# Patient Record
Sex: Female | Born: 1985 | Race: White | Hispanic: No | Marital: Single | Smoking: Current every day smoker
Health system: Southern US, Community
[De-identification: ages and names within clinical notes are randomized; demographics above are authoritative.]

## PROBLEM LIST (undated history)

## (undated) DIAGNOSIS — F419 Anxiety disorder, unspecified: Secondary | ICD-10-CM

## (undated) DIAGNOSIS — Z8051 Family history of malignant neoplasm of kidney: Secondary | ICD-10-CM

## (undated) DIAGNOSIS — N920 Excessive and frequent menstruation with regular cycle: Secondary | ICD-10-CM

## (undated) DIAGNOSIS — R112 Nausea with vomiting, unspecified: Secondary | ICD-10-CM

## (undated) DIAGNOSIS — A419 Sepsis, unspecified organism: Secondary | ICD-10-CM

## (undated) DIAGNOSIS — Z973 Presence of spectacles and contact lenses: Secondary | ICD-10-CM

## (undated) DIAGNOSIS — Z98891 History of uterine scar from previous surgery: Secondary | ICD-10-CM

## (undated) DIAGNOSIS — Z87410 Personal history of cervical dysplasia: Secondary | ICD-10-CM

## (undated) DIAGNOSIS — O24419 Gestational diabetes mellitus in pregnancy, unspecified control: Secondary | ICD-10-CM

## (undated) DIAGNOSIS — F988 Other specified behavioral and emotional disorders with onset usually occurring in childhood and adolescence: Secondary | ICD-10-CM

## (undated) DIAGNOSIS — Z9889 Other specified postprocedural states: Secondary | ICD-10-CM

## (undated) DIAGNOSIS — R87629 Unspecified abnormal cytological findings in specimens from vagina: Secondary | ICD-10-CM

## (undated) DIAGNOSIS — K219 Gastro-esophageal reflux disease without esophagitis: Secondary | ICD-10-CM

## (undated) DIAGNOSIS — Z8041 Family history of malignant neoplasm of ovary: Secondary | ICD-10-CM

## (undated) DIAGNOSIS — Z8481 Family history of carrier of genetic disease: Secondary | ICD-10-CM

## (undated) DIAGNOSIS — Z800 Family history of malignant neoplasm of digestive organs: Secondary | ICD-10-CM

## (undated) DIAGNOSIS — Z8632 Personal history of gestational diabetes: Secondary | ICD-10-CM

## (undated) DIAGNOSIS — N3281 Overactive bladder: Secondary | ICD-10-CM

## (undated) HISTORY — DX: Family history of malignant neoplasm of digestive organs: Z80.0

## (undated) HISTORY — PX: WISDOM TOOTH EXTRACTION: SHX21

## (undated) HISTORY — DX: Family history of carrier of genetic disease: Z84.81

## (undated) HISTORY — PX: TONSILLECTOMY: SUR1361

## (undated) HISTORY — DX: Family history of malignant neoplasm of kidney: Z80.51

## (undated) HISTORY — PX: CHOLECYSTECTOMY: SHX55

---

## 2008-06-28 HISTORY — PX: LAPAROSCOPIC CHOLECYSTECTOMY: SUR755

## 2016-02-19 ENCOUNTER — Emergency Department (HOSPITAL_COMMUNITY): Payer: Medicaid Other

## 2016-02-19 ENCOUNTER — Observation Stay (HOSPITAL_COMMUNITY): Payer: Medicaid Other | Admitting: Anesthesiology

## 2016-02-19 ENCOUNTER — Observation Stay (HOSPITAL_COMMUNITY)
Admission: EM | Admit: 2016-02-19 | Discharge: 2016-02-20 | Disposition: A | Discharge status: Home / Self Care | Payer: Medicaid Other | Attending: Surgery | Admitting: Surgery

## 2016-02-19 ENCOUNTER — Encounter (HOSPITAL_COMMUNITY)
Admission: EM | Disposition: A | Discharge status: Home / Self Care | Payer: Self-pay | Source: Home / Self Care | Attending: Emergency Medicine

## 2016-02-19 ENCOUNTER — Encounter (HOSPITAL_COMMUNITY): Payer: Self-pay | Admitting: Emergency Medicine

## 2016-02-19 DIAGNOSIS — N390 Urinary tract infection, site not specified: Secondary | ICD-10-CM | POA: Insufficient documentation

## 2016-02-19 DIAGNOSIS — F17210 Nicotine dependence, cigarettes, uncomplicated: Secondary | ICD-10-CM | POA: Diagnosis not present

## 2016-02-19 DIAGNOSIS — N764 Abscess of vulva: Secondary | ICD-10-CM | POA: Diagnosis not present

## 2016-02-19 DIAGNOSIS — L02215 Cutaneous abscess of perineum: Secondary | ICD-10-CM

## 2016-02-19 HISTORY — DX: Other specified postprocedural states: Z98.890

## 2016-02-19 HISTORY — DX: Nausea with vomiting, unspecified: R11.2

## 2016-02-19 HISTORY — PX: IRRIGATION AND DEBRIDEMENT ABSCESS: SHX5252

## 2016-02-19 HISTORY — DX: Sepsis, unspecified organism: A41.9

## 2016-02-19 LAB — I-STAT BETA HCG BLOOD, ED (MC, WL, AP ONLY): I-stat hCG, quantitative: <5 m[IU]/mL

## 2016-02-19 LAB — URINALYSIS, ROUTINE W REFLEX MICROSCOPIC
Bilirubin Urine: NEGATIVE
Glucose, UA: NEGATIVE mg/dL
Ketones, ur: NEGATIVE mg/dL
Nitrite: NEGATIVE
Protein, ur: NEGATIVE mg/dL
Specific Gravity, Urine: 1.024 (ref 1.005–1.030)
pH: 6 (ref 5.0–8.0)

## 2016-02-19 LAB — URINE MICROSCOPIC-ADD ON

## 2016-02-19 LAB — BASIC METABOLIC PANEL WITH GFR
Anion gap: 7 (ref 5–15)
BUN: 8 mg/dL (ref 6–20)
CO2: 22 mmol/L (ref 22–32)
Calcium: 8.9 mg/dL (ref 8.9–10.3)
Chloride: 108 mmol/L (ref 101–111)
Creatinine, Ser: 0.57 mg/dL (ref 0.44–1.00)
GFR calc Af Amer: >60 mL/min
GFR calc non Af Amer: >60 mL/min
Glucose, Bld: 88 mg/dL (ref 65–99)
Potassium: 3.5 mmol/L (ref 3.5–5.1)
Sodium: 137 mmol/L (ref 135–145)

## 2016-02-19 LAB — CBC WITH DIFFERENTIAL/PLATELET
Basophils Absolute: 0 K/uL (ref 0.0–0.1)
Basophils Relative: 0 %
Eosinophils Absolute: 0.1 K/uL (ref 0.0–0.7)
Eosinophils Relative: 1 %
HCT: 40.7 % (ref 36.0–46.0)
Hemoglobin: 13.8 g/dL (ref 12.0–15.0)
Lymphocytes Relative: 14 %
Lymphs Abs: 2.4 K/uL (ref 0.7–4.0)
MCH: 31.2 pg (ref 26.0–34.0)
MCHC: 33.9 g/dL (ref 30.0–36.0)
MCV: 92.1 fL (ref 78.0–100.0)
Monocytes Absolute: 0.9 K/uL (ref 0.1–1.0)
Monocytes Relative: 5 %
Neutro Abs: 14 K/uL — ABNORMAL HIGH (ref 1.7–7.7)
Neutrophils Relative %: 80 %
Platelets: 310 K/uL (ref 150–400)
RBC: 4.42 MIL/uL (ref 3.87–5.11)
RDW: 12.6 % (ref 11.5–15.5)
WBC: 17.4 K/uL — ABNORMAL HIGH (ref 4.0–10.5)

## 2016-02-19 LAB — WET PREP, GENITAL
Clue Cells Wet Prep HPF POC: NONE SEEN
Sperm: NONE SEEN
Trich, Wet Prep: NONE SEEN
Yeast Wet Prep HPF POC: NONE SEEN

## 2016-02-19 LAB — I-STAT CREATININE, ED: Creatinine, Ser: 0.6 mg/dL (ref 0.44–1.00)

## 2016-02-19 SURGERY — IRRIGATION AND DEBRIDEMENT ABSCESS
Anesthesia: General | Site: Vagina

## 2016-02-19 MED ORDER — BUPIVACAINE-EPINEPHRINE 0.5% -1:200000 IJ SOLN
INTRAMUSCULAR | Status: DC | PRN
  Administered 2016-02-19: 18 mL

## 2016-02-19 MED ORDER — HYDROMORPHONE HCL 1 MG/ML IJ SOLN: 0.2500 mg | INTRAMUSCULAR | Status: DC | PRN

## 2016-02-19 MED ORDER — LIDOCAINE HCL (CARDIAC) 20 MG/ML IV SOLN
INTRAVENOUS | Status: DC | PRN
  Administered 2016-02-19: 100 mg via INTRAVENOUS

## 2016-02-19 MED ORDER — DIPHENHYDRAMINE HCL 25 MG PO CAPS: 25.0000 mg | ORAL_CAPSULE | Freq: Four times a day (QID) | ORAL | Status: DC | PRN

## 2016-02-19 MED ORDER — PROPOFOL 10 MG/ML IV BOLUS
INTRAVENOUS | Status: AC
  Filled 2016-02-19: qty 20

## 2016-02-19 MED ORDER — KETOROLAC TROMETHAMINE 30 MG/ML IJ SOLN
INTRAMUSCULAR | Status: AC
  Filled 2016-02-19: qty 1

## 2016-02-19 MED ORDER — ACETAMINOPHEN 650 MG RE SUPP: 650.0000 mg | Freq: Four times a day (QID) | RECTAL | Status: DC | PRN

## 2016-02-19 MED ORDER — ONDANSETRON 4 MG PO TBDP
4.0000 mg | ORAL_TABLET | Freq: Four times a day (QID) | ORAL | Status: DC | PRN
  Administered 2016-02-19 – 2016-02-20 (×2): 4 mg via ORAL
  Filled 2016-02-19 (×2): qty 1

## 2016-02-19 MED ORDER — CEFTRIAXONE SODIUM 2 G IJ SOLR
2.0000 g | Freq: Once | INTRAMUSCULAR | Status: AC
  Administered 2016-02-19: 2 g via INTRAVENOUS
  Filled 2016-02-19: qty 2

## 2016-02-19 MED ORDER — DIPHENHYDRAMINE HCL 50 MG/ML IJ SOLN: 25.0000 mg | Freq: Four times a day (QID) | INTRAMUSCULAR | Status: DC | PRN

## 2016-02-19 MED ORDER — SODIUM CHLORIDE 0.9 % IR SOLN
Status: DC | PRN
  Administered 2016-02-19: 1000 mL

## 2016-02-19 MED ORDER — AZITHROMYCIN 250 MG PO TABS
1000.0000 mg | ORAL_TABLET | Freq: Once | ORAL | Status: AC
  Administered 2016-02-19: 1000 mg via ORAL
  Filled 2016-02-19: qty 4

## 2016-02-19 MED ORDER — METOCLOPRAMIDE HCL 5 MG/ML IJ SOLN
INTRAMUSCULAR | Status: DC | PRN
  Administered 2016-02-19: 10 mg via INTRAVENOUS

## 2016-02-19 MED ORDER — ONDANSETRON HCL 4 MG/2ML IJ SOLN
INTRAMUSCULAR | Status: AC
  Filled 2016-02-19: qty 2

## 2016-02-19 MED ORDER — IOPAMIDOL (ISOVUE-300) INJECTION 61%
100.0000 mL | Freq: Once | INTRAVENOUS | Status: AC | PRN
  Administered 2016-02-19: 100 mL via INTRAVENOUS

## 2016-02-19 MED ORDER — FENTANYL CITRATE (PF) 100 MCG/2ML IJ SOLN
INTRAMUSCULAR | Status: AC
  Filled 2016-02-19: qty 4

## 2016-02-19 MED ORDER — BUPIVACAINE-EPINEPHRINE (PF) 0.5% -1:200000 IJ SOLN
INTRAMUSCULAR | Status: AC
  Filled 2016-02-19: qty 30

## 2016-02-19 MED ORDER — PROPOFOL 10 MG/ML IV BOLUS
INTRAVENOUS | Status: DC | PRN
  Administered 2016-02-19: 200 mg via INTRAVENOUS

## 2016-02-19 MED ORDER — ACETAMINOPHEN 325 MG PO TABS: 650.0000 mg | ORAL_TABLET | Freq: Four times a day (QID) | ORAL | Status: DC | PRN

## 2016-02-19 MED ORDER — DIATRIZOATE MEGLUMINE & SODIUM 66-10 % PO SOLN
15.0000 mL | Freq: Once | ORAL | Status: AC
  Administered 2016-02-19: 15 mL via ORAL

## 2016-02-19 MED ORDER — HYDROMORPHONE HCL 1 MG/ML IJ SOLN
0.5000 mg | INTRAMUSCULAR | Status: DC | PRN
  Administered 2016-02-19 – 2016-02-20 (×3): 1 mg via INTRAVENOUS
  Filled 2016-02-19 (×3): qty 1

## 2016-02-19 MED ORDER — DEXAMETHASONE SODIUM PHOSPHATE 10 MG/ML IJ SOLN
INTRAMUSCULAR | Status: AC
  Filled 2016-02-19: qty 1

## 2016-02-19 MED ORDER — MORPHINE SULFATE (PF) 2 MG/ML IV SOLN
2.0000 mg | Freq: Once | INTRAVENOUS | Status: AC
  Administered 2016-02-19: 2 mg via INTRAVENOUS
  Filled 2016-02-19: qty 1

## 2016-02-19 MED ORDER — OXYCODONE-ACETAMINOPHEN 5-325 MG PO TABS
1.0000 | ORAL_TABLET | ORAL | Status: DC | PRN
  Administered 2016-02-19 – 2016-02-20 (×2): 1 via ORAL
  Filled 2016-02-19 (×3): qty 1

## 2016-02-19 MED ORDER — ONDANSETRON HCL 4 MG/2ML IJ SOLN
4.0000 mg | Freq: Four times a day (QID) | INTRAMUSCULAR | Status: DC | PRN
  Administered 2016-02-19: 4 mg via INTRAVENOUS

## 2016-02-19 MED ORDER — IBUPROFEN 800 MG PO TABS
800.0000 mg | ORAL_TABLET | Freq: Once | ORAL | Status: AC
  Administered 2016-02-19: 800 mg via ORAL
  Filled 2016-02-19: qty 1

## 2016-02-19 MED ORDER — MIDAZOLAM HCL 2 MG/2ML IJ SOLN
INTRAMUSCULAR | Status: AC
  Filled 2016-02-19: qty 2

## 2016-02-19 MED ORDER — KETOROLAC TROMETHAMINE 30 MG/ML IJ SOLN
INTRAMUSCULAR | Status: DC | PRN
  Administered 2016-02-19: 30 mg via INTRAVENOUS

## 2016-02-19 MED ORDER — MIDAZOLAM HCL 5 MG/5ML IJ SOLN
INTRAMUSCULAR | Status: DC | PRN
  Administered 2016-02-19: 2 mg via INTRAVENOUS

## 2016-02-19 MED ORDER — HEPARIN SODIUM (PORCINE) 5000 UNIT/ML IJ SOLN: 5000.0000 [IU] | Freq: Three times a day (TID) | INTRAMUSCULAR | Status: DC

## 2016-02-19 MED ORDER — DEXAMETHASONE SODIUM PHOSPHATE 10 MG/ML IJ SOLN
INTRAMUSCULAR | Status: DC | PRN
  Administered 2016-02-19: 10 mg via INTRAVENOUS

## 2016-02-19 MED ORDER — HYDROCODONE-ACETAMINOPHEN 7.5-325 MG PO TABS: 1.0000 | ORAL_TABLET | Freq: Once | ORAL | Status: DC | PRN

## 2016-02-19 MED ORDER — DEXTROSE 5 % IV SOLN
2.0000 g | INTRAVENOUS | Status: DC
  Filled 2016-02-19: qty 2

## 2016-02-19 MED ORDER — METOCLOPRAMIDE HCL 5 MG/ML IJ SOLN
INTRAMUSCULAR | Status: AC
  Filled 2016-02-19: qty 2

## 2016-02-19 MED ORDER — PROMETHAZINE HCL 25 MG/ML IJ SOLN: 6.2500 mg | INTRAMUSCULAR | Status: DC | PRN

## 2016-02-19 MED ORDER — FENTANYL CITRATE (PF) 100 MCG/2ML IJ SOLN
INTRAMUSCULAR | Status: DC | PRN
  Administered 2016-02-19: 50 ug via INTRAVENOUS
  Administered 2016-02-19: 100 ug via INTRAVENOUS
  Administered 2016-02-19: 50 ug via INTRAVENOUS

## 2016-02-19 MED ORDER — KETOROLAC TROMETHAMINE 30 MG/ML IJ SOLN
30.0000 mg | Freq: Once | INTRAMUSCULAR | Status: AC
  Administered 2016-02-19: 30 mg via INTRAVENOUS
  Filled 2016-02-19: qty 1

## 2016-02-19 MED ORDER — ONDANSETRON HCL 4 MG/2ML IJ SOLN
4.0000 mg | Freq: Once | INTRAMUSCULAR | Status: AC
  Administered 2016-02-19: 4 mg via INTRAVENOUS
  Filled 2016-02-19: qty 2

## 2016-02-19 MED ORDER — LACTATED RINGERS IV SOLN
INTRAVENOUS | Status: DC | PRN
  Administered 2016-02-19: 17:00:00 via INTRAVENOUS

## 2016-02-19 SURGICAL SUPPLY — 35 items
BLADE HEX COATED 2.75 (ELECTRODE) ×3 IMPLANT
BLADE SURG SZ10 CARB STEEL (BLADE) ×3 IMPLANT
COVER SURGICAL LIGHT HANDLE (MISCELLANEOUS) ×3 IMPLANT
DECANTER SPIKE VIAL GLASS SM (MISCELLANEOUS) IMPLANT
DRAPE LAPAROSCOPIC ABDOMINAL (DRAPES) IMPLANT
DRAPE LAPAROTOMY T 102X78X121 (DRAPES) IMPLANT
DRAPE LAPAROTOMY TRNSV 102X78 (DRAPE) IMPLANT
DRAPE SHEET LG 3/4 BI-LAMINATE (DRAPES) IMPLANT
ELECT PENCIL ROCKER SW 15FT (MISCELLANEOUS) ×3 IMPLANT
ELECT REM PT RETURN 9FT ADLT (ELECTROSURGICAL) ×3
ELECTRODE REM PT RTRN 9FT ADLT (ELECTROSURGICAL) ×1 IMPLANT
GAUZE SPONGE 4X4 12PLY STRL (GAUZE/BANDAGES/DRESSINGS) ×3 IMPLANT
GLOVE BIO SURGEON STRL SZ7 (GLOVE) ×9 IMPLANT
GLOVE BIOGEL PI IND STRL 7.0 (GLOVE) ×1 IMPLANT
GLOVE BIOGEL PI IND STRL 7.5 (GLOVE) ×3 IMPLANT
GLOVE BIOGEL PI INDICATOR 7.0 (GLOVE) ×2
GLOVE BIOGEL PI INDICATOR 7.5 (GLOVE) ×6
GLOVE SURG SIGNA 7.5 PF LTX (GLOVE) ×3 IMPLANT
GOWN STRL REUS W/ TWL XL LVL3 (GOWN DISPOSABLE) ×1 IMPLANT
GOWN STRL REUS W/TWL LRG LVL3 (GOWN DISPOSABLE) ×6 IMPLANT
GOWN STRL REUS W/TWL XL LVL3 (GOWN DISPOSABLE) ×5 IMPLANT
KIT BASIN OR (CUSTOM PROCEDURE TRAY) ×3 IMPLANT
LIQUID BAND (GAUZE/BANDAGES/DRESSINGS) IMPLANT
MARKER SKIN DUAL TIP RULER LAB (MISCELLANEOUS) ×3 IMPLANT
NEEDLE HYPO 25X1 1.5 SAFETY (NEEDLE) ×3 IMPLANT
NS IRRIG 1000ML POUR BTL (IV SOLUTION) ×3 IMPLANT
PACK BASIC VI WITH GOWN DISP (CUSTOM PROCEDURE TRAY) ×3 IMPLANT
SPONGE LAP 18X18 X RAY DECT (DISPOSABLE) IMPLANT
SPONGE LAP 4X18 X RAY DECT (DISPOSABLE) IMPLANT
STAPLER VISISTAT 35W (STAPLE) IMPLANT
SUT MNCRL AB 4-0 PS2 18 (SUTURE) IMPLANT
SUT VIC AB 3-0 SH 18 (SUTURE) IMPLANT
SYR CONTROL 10ML LL (SYRINGE) ×3 IMPLANT
TOWEL OR 17X26 10 PK STRL BLUE (TOWEL DISPOSABLE) ×3 IMPLANT
YANKAUER SUCT BULB TIP 10FT TU (MISCELLANEOUS) ×3 IMPLANT

## 2016-02-19 NOTE — H&P (Signed)
Laurie Warren is an 30 y.o. female.   Chief Complaint:  Left sided vaginal mass going to rectum, pain and difficulty voiding. HPI: 30 y/o female seen with the above noted complaints that started about 48 hours ago.  She could not get into see any of the OB-Gyn doctors and came to the ED.    Work up shows she is afebrile, VSS. Labs show normal BMP, WBC 17.4, Probable UTI, She refused vaginal exam by ED.  CT scan show a 17 x 34 x 22 mm subfascial fluid collection.  Minimal surrounding fat stranding, no subcutaneous gas or foreign bodies seen on CT.  History reviewed. No pertinent past medical history.  None  Past Surgical History:  Procedure Laterality Date  . CESAREAN SECTION     8 years ago  . CHOLECYSTECTOMY      No family history on file. Social History:  reports that she has been smoking Cigarettes.  She has never used smokeless tobacco. She reports that she drinks alcohol. Her drug history is not on file.  Allergies:  Allergies  Allergen Reactions  . Sulfa Antibiotics Anaphylaxis  . Latex Swelling      Redness.       Pain pills cause nausea- needs antiemetic with them   Prior to Admission medications   Medication Sig Start Date End Date Taking? Authorizing Provider  ibuprofen (ADVIL,MOTRIN) 200 MG tablet Take 800 mg by mouth every 6 (six) hours as needed for moderate pain.   Yes Historical Provider, MD     (Not in a hospital admission)  Results for orders placed or performed during the hospital encounter of 02/19/16 (from the past 48 hour(s))  CBC with Differential     Status: Abnormal   Collection Time: 02/19/16  1:01 PM  Result Value Ref Range   WBC 17.4 (H) 4.0 - 10.5 K/uL   RBC 4.42 3.87 - 5.11 MIL/uL   Hemoglobin 13.8 12.0 - 15.0 g/dL   HCT 40.7 36.0 - 46.0 %   MCV 92.1 78.0 - 100.0 fL   MCH 31.2 26.0 - 34.0 pg   MCHC 33.9 30.0 - 36.0 g/dL   RDW 12.6 11.5 - 15.5 %   Platelets 310 150 - 400 K/uL   Neutrophils Relative % 80 %   Neutro Abs 14.0 (H) 1.7 - 7.7 K/uL    Lymphocytes Relative 14 %   Lymphs Abs 2.4 0.7 - 4.0 K/uL   Monocytes Relative 5 %   Monocytes Absolute 0.9 0.1 - 1.0 K/uL   Eosinophils Relative 1 %   Eosinophils Absolute 0.1 0.0 - 0.7 K/uL   Basophils Relative 0 %   Basophils Absolute 0.0 0.0 - 0.1 K/uL  Basic metabolic panel     Status: None   Collection Time: 02/19/16  1:01 PM  Result Value Ref Range   Sodium 137 135 - 145 mmol/L   Potassium 3.5 3.5 - 5.1 mmol/L   Chloride 108 101 - 111 mmol/L   CO2 22 22 - 32 mmol/L   Glucose, Bld 88 65 - 99 mg/dL   BUN 8 6 - 20 mg/dL   Creatinine, Ser 0.57 0.44 - 1.00 mg/dL   Calcium 8.9 8.9 - 10.3 mg/dL   GFR calc non Af Amer >60 >60 mL/min   GFR calc Af Amer >60 >60 mL/min    Comment: (NOTE) The eGFR has been calculated using the CKD EPI equation. This calculation has not been validated in all clinical situations. eGFR's persistently <60 mL/min signify possible Chronic  Kidney Disease.    Anion gap 7 5 - 15  Urinalysis, Routine w reflex microscopic     Status: Abnormal   Collection Time: 02/19/16  1:06 PM  Result Value Ref Range   Color, Urine YELLOW YELLOW   APPearance CLOUDY (A) CLEAR   Specific Gravity, Urine 1.024 1.005 - 1.030   pH 6.0 5.0 - 8.0   Glucose, UA NEGATIVE NEGATIVE mg/dL   Hgb urine dipstick SMALL (A) NEGATIVE   Bilirubin Urine NEGATIVE NEGATIVE   Ketones, ur NEGATIVE NEGATIVE mg/dL   Protein, ur NEGATIVE NEGATIVE mg/dL   Nitrite NEGATIVE NEGATIVE   Leukocytes, UA SMALL (A) NEGATIVE  Urine microscopic-add on     Status: Abnormal   Collection Time: 02/19/16  1:06 PM  Result Value Ref Range   Squamous Epithelial / LPF 6-30 (A) NONE SEEN   WBC, UA 6-30 0 - 5 WBC/hpf   RBC / HPF 6-30 0 - 5 RBC/hpf   Bacteria, UA MANY (A) NONE SEEN   Urine-Other MUCOUS PRESENT   I-stat Creatinine, ED     Status: None   Collection Time: 02/19/16  1:24 PM  Result Value Ref Range   Creatinine, Ser 0.60 0.44 - 1.00 mg/dL  I-Stat beta hCG blood, ED     Status: None   Collection  Time: 02/19/16  1:36 PM  Result Value Ref Range   I-stat hCG, quantitative <5.0 <5 mIU/mL   Comment 3            Comment:   GEST. AGE      CONC.  (mIU/mL)   <=1 WEEK        5 - 50     2 WEEKS       50 - 500     3 WEEKS       100 - 10,000     4 WEEKS     1,000 - 30,000        FEMALE AND NON-PREGNANT FEMALE:     LESS THAN 5 mIU/mL    Ct Abdomen Pelvis W Contrast  Result Date: 02/19/2016 CLINICAL DATA:  Painful perineal mass beginning 2 days ago, from LEFT vagina to LEFT buttock. Difficulty urinating. History of cholecystectomy and abscess. EXAM: CT ABDOMEN AND PELVIS WITH CONTRAST TECHNIQUE: Multidetector CT imaging of the abdomen and pelvis was performed using the standard protocol following bolus administration of intravenous contrast. CONTRAST:  150m ISOVUE-300 IOPAMIDOL (ISOVUE-300) INJECTION 61% COMPARISON:  None. FINDINGS: LUNG BASES: Included view of the lung bases are clear. Visualized heart and pericardium are unremarkable. SOLID ORGANS: The liver, spleen, pancreas and adrenal glands are unremarkable. Status post cholecystectomy. GASTROINTESTINAL TRACT: The stomach, small and large bowel are normal in course and caliber without inflammatory changes. Normal appendix. KIDNEYS/ URINARY TRACT: Kidneys are orthotopic, demonstrating symmetric enhancement. No nephrolithiasis, hydronephrosis or solid renal masses. The unopacified ureters are normal in course and caliber. Urinary bladder is partially distended and unremarkable. PERITONEUM/RETROPERITONEUM: Aortoiliac vessels are normal in course and caliber. No lymphadenopathy by CT size criteria. Internal reproductive organs are unremarkable. No intraperitoneal free fluid nor free air. Phleboliths in the pelvis. SOFT TISSUE/OSSEOUS STRUCTURES: LEFT perineal 17 x 34 x 22 mm rim enhancing fluid collection without subfascial extent, within 13 mm of skin surface. Minimal surrounding fat stranding without subcutaneous gas or radiopaque foreign bodies.  IMPRESSION: 17 x 34 x 22 mm LEFT perivaginal/ perineal abscess . No acute intra-abdominal or pelvic process. Electronically Signed   By: CElon AlasM.D.   On:  02/19/2016 14:27    Review of Systems  Constitutional: Negative.   HENT: Negative.   Eyes: Negative.   Respiratory: Negative.   Cardiovascular: Negative.   Gastrointestinal: Negative.   Genitourinary: Negative.   Musculoskeletal: Negative.   Skin: Negative.   Neurological: Negative.   Endo/Heme/Allergies: Negative.     Blood pressure 102/65, pulse 83, temperature 98.9 F (37.2 C), temperature source Oral, resp. rate 17, height _0  (1.575 m), weight 59 kg (130 lb), last menstrual period 01/31/2016, SpO2 98 %. Physical Exam  Constitutional: She is oriented to person, place, and time. She appears well-developed and well-nourished. She appears distressed.  HENT:  Head: Normocephalic and atraumatic.  Mouth/Throat: No oropharyngeal exudate.  Eyes: Right eye exhibits no discharge. Left eye exhibits no discharge. No scleral icterus.  Neck: Normal range of motion. Neck supple. No JVD present. No tracheal deviation present. No thyromegaly present.  Cardiovascular: Normal rate, regular rhythm, normal heart sounds and intact distal pulses.   No murmur heard. Respiratory: Effort normal and breath sounds normal. No respiratory distress. She has no wheezes. She has no rales. She exhibits no tenderness.  GI: Soft. Bowel sounds are normal. She exhibits no distension and no mass. There is no tenderness. There is no rebound and no guarding.  Genitourinary:  Genitourinary Comments: Swelling and tender left labia majora, some erythema goes toward rectum  Musculoskeletal: She exhibits no edema or tenderness.  Lymphadenopathy:    She has no cervical adenopathy.  Neurological: She is alert and oriented to person, place, and time. No cranial nerve deficit.  Skin: Skin is warm and dry. No rash noted. No erythema. No pallor.  Psychiatric:  She has a normal mood and affect. Her behavior is normal. Judgment and thought content normal.     Assessment/Plan Perivaginal abscess UTI ED will treat for possible GC/Chlymadia   Plan:  Admit and take to OR for I&D of site.  Jannat Rosemeyer, PA-C 02/19/2016, 4:16 PM

## 2016-02-19 NOTE — Progress Notes (Signed)
Patient ID: Laurie Warren, female   DOB: May 19, 1986, 30 y.o.   MRN: [id number]   Patient with perineal/labial abscess. Needs Incision and Drainage in the OR.  I discussed the procedure and risks with the patient.  These include but are not limited to bleeding, infection, injury to surrounding structures, recurrence, need for further surgery, etc.  She agrees to proceed.

## 2016-02-19 NOTE — ED Provider Notes (Signed)
[facility] DEPT Provider Note   CSN: [id number] Arrival date & time: 02/19/16  1113     History   Chief Complaint Chief Complaint  Patient presents with  . genital mass  . Urinary Retention    HPI Laurie Warren is a 30 y.o. female with no significant Medical history who presents to the ED today complaining of pelvic mass and pain. Patient states that 2 days ago she noticed a tingling sensation and pain to her external vagina. Patient states that since that time the area has continued to swell and become more painful. Patient states that now she is having difficulty urinating because she feels like the urine cannot get around the mass. Patient states it is painful to sit down. She denies any vaginal discharge, dysuria, hematuria, vaginal bleeding. LMP was 01/31/2016. Patient states that she attempted to call multiple OB/GYN prior to coming but was unable to get an appointment. She denies any fevers or chills. Patient has been taken ibuprofen without relief of her pain.  HPI  History reviewed. No pertinent past medical history.  There are no active problems to display for this patient.   Past Surgical History:  Procedure Laterality Date  . CHOLECYSTECTOMY      OB History    No data available       Home Medications    Prior to Admission medications   Medication Sig Start Date End Date Taking? Authorizing Provider  ibuprofen (ADVIL,MOTRIN) 200 MG tablet Take 800 mg by mouth every 6 (six) hours as needed for moderate pain.   Yes Historical Provider, MD    Family History No family history on file.  Social History Social History  Substance Use Topics  . Smoking status: Current Every Day Smoker    Types: Cigarettes  . Smokeless tobacco: Never Used  . Alcohol use Yes     Comment: social drinker     Allergies   Sulfa antibiotics and Latex   Review of Systems Review of Systems  All other systems reviewed and are negative.    Physical Exam Updated Vital  Signs BP 112/90 (BP Location: Left Arm)   Pulse 93   Temp 98.9 F (37.2 C) (Oral)   Resp 18   Ht 5\' 2"  (1.575 m)   Wt 59 kg   LMP 01/31/2016   SpO2 100%   BMI 23.78 kg/m   Physical Exam  Constitutional: She is oriented to person, place, and time. She appears well-developed and well-nourished. No distress.  HENT:  Head: Normocephalic and atraumatic.  Mouth/Throat: No oropharyngeal exudate.  Eyes: Conjunctivae and EOM are normal. Pupils are equal, round, and reactive to light. Right eye exhibits no discharge. Left eye exhibits no discharge. No scleral icterus.  Cardiovascular: Normal rate, regular rhythm, normal heart sounds and intact distal pulses.  Exam reveals no gallop and no friction rub.   No murmur heard. Pulmonary/Chest: Effort normal and breath sounds normal. No respiratory distress. She has no wheezes. She has no rales. She exhibits no tenderness.  Abdominal: Soft. She exhibits no distension. There is no tenderness. There is no guarding.  Genitourinary:  Genitourinary Comments: Large fluctuant mass at base of left vagina extending into peroneum and internal vagina. Extremely TTP.  Musculoskeletal: Normal range of motion. She exhibits no edema.  Neurological: She is alert and oriented to person, place, and time.  Skin: Skin is warm and dry. No rash noted. She is not diaphoretic. No erythema. No pallor.  Psychiatric: She has a normal mood and affect.  Her behavior is normal.  Nursing note and vitals reviewed.    ED Treatments / Results  Labs (all labs ordered are listed, but only abnormal results are displayed) Labs Reviewed - No data to display  EKG  EKG Interpretation None       Radiology Ct Abdomen Pelvis W Contrast  Result Date: 02/19/2016 CLINICAL DATA:  Painful perineal mass beginning 2 days ago, from LEFT vagina to LEFT buttock. Difficulty urinating. History of cholecystectomy and abscess. EXAM: CT ABDOMEN AND PELVIS WITH CONTRAST TECHNIQUE: Multidetector  CT imaging of the abdomen and pelvis was performed using the standard protocol following bolus administration of intravenous contrast. CONTRAST:  121mL ISOVUE-300 IOPAMIDOL (ISOVUE-300) INJECTION 61% COMPARISON:  None. FINDINGS: LUNG BASES: Included view of the lung bases are clear. Visualized heart and pericardium are unremarkable. SOLID ORGANS: The liver, spleen, pancreas and adrenal glands are unremarkable. Status post cholecystectomy. GASTROINTESTINAL TRACT: The stomach, small and large bowel are normal in course and caliber without inflammatory changes. Normal appendix. KIDNEYS/ URINARY TRACT: Kidneys are orthotopic, demonstrating symmetric enhancement. No nephrolithiasis, hydronephrosis or solid renal masses. The unopacified ureters are normal in course and caliber. Urinary bladder is partially distended and unremarkable. PERITONEUM/RETROPERITONEUM: Aortoiliac vessels are normal in course and caliber. No lymphadenopathy by CT size criteria. Internal reproductive organs are unremarkable. No intraperitoneal free fluid nor free air. Phleboliths in the pelvis. SOFT TISSUE/OSSEOUS STRUCTURES: LEFT perineal 17 x 34 x 22 mm rim enhancing fluid collection without subfascial extent, within 13 mm of skin surface. Minimal surrounding fat stranding without subcutaneous gas or radiopaque foreign bodies. IMPRESSION: 17 x 34 x 22 mm LEFT perivaginal/ perineal abscess . No acute intra-abdominal or pelvic process. Electronically Signed   By: Elon Alas M.D.   On: 02/19/2016 14:27    Procedures Procedures (including critical care time)  Medications Ordered in ED Medications  ibuprofen (ADVIL,MOTRIN) tablet 800 mg (not administered)     Initial Impression / Assessment and Plan / ED Course  I have reviewed the triage vital signs and the nursing notes.  Pertinent labs & imaging results that were available during my care of the patient were reviewed by me and considered in my medical decision making (see chart  for details).  Otherwise healthy 30 y.o F presents to the ED today c/o vaginal pain and mass that has been developing over the last 2 days. Mass has gotten progressively larger and now causing urinary retention. On exam, pt appears very uncomfortable, unable to sit down. Afebrile. Fluctuant mass seen on left lower labia and extending into peroneum and perirectal area. Mass also extends into vagina. Extremely TTP. Concern for deeper space infection. CT scan obtained which reveals 17 x 34 x 22 left perineal abscess. Partially distended urinary bladder. Leukocytosis present, WBC 17.6 with left shift. Concern that abscess may be too complex to perform at bedside, pt in severe pain. Will consult surgery to get input if this may require possible OR drainage.  Clinical Course  Comment By Time  Spoke with Will Creig Hines, PA-C with general surgery who states that they will take pt to OR for I&D. Recommends collecting swabs for GC, chlamydia. Will give 2g Rocephin now. Will schedule azithromycin to be given in the AM. Pt made NPO Carlos Levering, PA-C 08/24 1549       Final Clinical Impressions(s) / ED Diagnoses   Final diagnoses:  Perineal abscess    New Prescriptions New Prescriptions   No medications on file     Carlos Levering,  PA-C 02/19/16 Boon Liu, MD 02/19/16 [phone]

## 2016-02-19 NOTE — ED Triage Notes (Signed)
Patient c/o of mass in left side of vagina that spreads to left buttock. Patient states that started on Tuesday and is very painful and now patient is having a hard time getting urin to come out.  Patient states that there is no head and no drainage.

## 2016-02-19 NOTE — Transfer of Care (Signed)
Immediate Anesthesia Transfer of Care Note  Patient: Laurie Warren  Procedure(s) Performed: Procedure(s): IRRIGATION AND DEBRIDEMENT LEFT LABIAL ABSCESS (N/A)  Patient Location: PACU  Anesthesia Type:General  Level of Consciousness:  sedated, patient cooperative and responds to stimulation  Airway & Oxygen Therapy:Patient Spontanous Breathing and Patient connected to face mask oxgen  Post-op Assessment:  Report given to PACU RN and Post -op Vital signs reviewed and stable  Post vital signs:  Reviewed and stable  Last Vitals:  Vitals:   02/19/16 1433 02/19/16 1625  BP: 102/65 109/79  Pulse: 83 78  Resp: 17 18  Temp:      Complications: No apparent anesthesia complications

## 2016-02-19 NOTE — ED Notes (Signed)
Patient comes from home with c/o vaginal pain.  Patient endorses nausea and chills, but denies fever.  Patient has area of redness and induration to around 5 o'clock along labia majora.  Erythema and induration extends several cm below labia with cellulitis extending even further.

## 2016-02-19 NOTE — Progress Notes (Signed)
Pt transferred to 1522, with 2 belongings bags.  Pt put contacts in, and verifed all possession intact(Wallet, phone, clothing)

## 2016-02-19 NOTE — Anesthesia Procedure Notes (Signed)
Procedure Name: LMA Insertion Date/Time: 02/19/2016 5:19 PM Performed by: Gurnoor Ursua, Virgel Gess Pre-anesthesia Checklist: Patient identified, Emergency Drugs available, Suction available and Patient being monitored Patient Re-evaluated:Patient Re-evaluated prior to inductionOxygen Delivery Method: Circle System Utilized Preoxygenation: Pre-oxygenation with 100% oxygen Intubation Type: IV induction Ventilation: Mask ventilation without difficulty LMA: LMA inserted LMA Size: 4.0 Number of attempts: 1 Airway Equipment and Method: Bite block Placement Confirmation: positive ETCO2 Tube secured with: Tape Dental Injury: Teeth and Oropharynx as per pre-operative assessment

## 2016-02-19 NOTE — Anesthesia Postprocedure Evaluation (Signed)
Anesthesia Post Note  Patient: [name]  Procedure(s) Performed: Procedure(s) (LRB): IRRIGATION AND DEBRIDEMENT LEFT LABIAL ABSCESS (N/A)  Patient location during evaluation: PACU Anesthesia Type: General Level of consciousness: awake and alert Pain management: pain level controlled Vital Signs Assessment: post-procedure vital signs reviewed and stable Respiratory status: spontaneous breathing, nonlabored ventilation, respiratory function stable and patient connected to nasal cannula oxygen Cardiovascular status: blood pressure returned to baseline and stable Postop Assessment: no signs of nausea or vomiting Anesthetic complications: no    Last Vitals:  Vitals:   02/19/16 1749 02/19/16 1800  BP:  120/83  Pulse: 98 (!) 109  Resp: 16 16  Temp: 36.7 C     Last Pain:  Vitals:   02/19/16 1800  TempSrc:   PainSc: 0-No pain                 Zenaida Deed

## 2016-02-19 NOTE — Progress Notes (Signed)
Pharmacy Antibiotic Note  Laurie Warren is a 30 y.o. female admitted on 02/19/2016 with perivaginal abscess and UTI.  Pharmacy has been consulted for Rocephin dosing.  Plan: Rocephin 2 GM IV Q24H starting tomorrow @1600  s/p 2gm IV x 1 in the ED. Will sign off.  Please reconsult if further assistance is needed.  Height: 5\' 2"  (157.5 cm) Weight: 130 lb (59 kg) IBW/kg (Calculated) : 50.1  Temp (24hrs), Avg:98.9 F (37.2 C), Min:98.9 F (37.2 C), Max:98.9 F (37.2 C)   Recent Labs Lab 02/19/16 1301 02/19/16 1324  WBC 17.4*  --   CREATININE 0.57 0.60    Estimated Creatinine Clearance: 81.3 mL/min (by C-G formula based on SCr of 0.8 mg/dL).    Allergies  Allergen Reactions  . Sulfa Antibiotics Anaphylaxis  . Latex Swelling    Redness.     Antimicrobials this admission: 8/24 Rocephin >>    Microbiology results: 8/24  UCx:   Thank you for allowing pharmacy to be a part of this patient's care.  Garnet Sierras 02/19/2016 4:34 PM

## 2016-02-19 NOTE — Op Note (Signed)
IRRIGATION AND DEBRIDEMENT LEFT LABIAL ABSCESS  Procedure Note  Laurie Warren 02/19/2016   Pre-op Diagnosis: left vaginal-labial abscess     Post-op Diagnosis: same  Procedure(s): INCISION AND DRAINAGE LEFT LABIAL ABSCESS  Surgeon(s): Coralie Keens, MD  Anesthesia: General  Staff:  Circulator: Tamala Julian, RN Scrub Person: Johnn Hai, CST  Estimated Blood Loss: Minimal                         Laurie Warren   Date: 02/19/2016  Time: 5:35 PM

## 2016-02-19 NOTE — Anesthesia Preprocedure Evaluation (Addendum)
Anesthesia Evaluation  Patient identified by MRN, date of birth, ID band Patient awake    Reviewed: Allergy & Precautions, H&P , NPO status , Patient's Chart, lab work & pertinent test results  History of Anesthesia Complications (+) PONVNegative for: history of anesthetic complications  Airway Mallampati: II  TM Distance: >3 FB Neck ROM: full    Dental no notable dental hx.    Pulmonary neg pulmonary ROS, Current Smoker,    Pulmonary exam normal breath sounds clear to auscultation       Cardiovascular negative cardio ROS Normal cardiovascular exam Rhythm:regular Rate:Normal     Neuro/Psych negative neurological ROS     GI/Hepatic negative GI ROS, Neg liver ROS,   Endo/Other  negative endocrine ROS  Renal/GU negative Renal ROS     Musculoskeletal   Abdominal   Peds  Hematology negative hematology ROS (+)   Anesthesia Other Findings   Reproductive/Obstetrics negative OB ROS                            Anesthesia Physical Anesthesia Plan  ASA: II and emergent  Anesthesia Plan: General   Post-op Pain Management:    Induction: Intravenous  Airway Management Planned: Oral ETT  Additional Equipment:   Intra-op Plan:   Post-operative Plan: Extubation in OR  Informed Consent: I have reviewed the patients History and Physical, chart, labs and discussed the procedure including the risks, benefits and alternatives for the proposed anesthesia with the patient or authorized representative who has indicated his/her understanding and acceptance.   Dental Advisory Given  Plan Discussed with: Anesthesiologist, CRNA and Surgeon  Anesthesia Plan Comments:        Anesthesia Quick Evaluation

## 2016-02-20 ENCOUNTER — Encounter (HOSPITAL_COMMUNITY): Payer: Self-pay | Admitting: Surgery

## 2016-02-20 LAB — CBC
HEMATOCRIT: 37.7 % (ref 36.0–46.0)
HEMOGLOBIN: 12.8 g/dL (ref 12.0–15.0)
MCH: 31.4 pg (ref 26.0–34.0)
MCHC: 34 g/dL (ref 30.0–36.0)
MCV: 92.6 fL (ref 78.0–100.0)
Platelets: 283 10*3/uL (ref 150–400)
RBC: 4.07 MIL/uL (ref 3.87–5.11)
RDW: 12.4 % (ref 11.5–15.5)
WBC: 13.4 10*3/uL — ABNORMAL HIGH (ref 4.0–10.5)

## 2016-02-20 LAB — BASIC METABOLIC PANEL WITH GFR
Anion gap: 7 (ref 5–15)
BUN: 11 mg/dL (ref 6–20)
CO2: 23 mmol/L (ref 22–32)
Calcium: 9.2 mg/dL (ref 8.9–10.3)
Chloride: 107 mmol/L (ref 101–111)
Creatinine, Ser: 0.65 mg/dL (ref 0.44–1.00)
GFR calc Af Amer: >60 mL/min
GFR calc non Af Amer: >60 mL/min
Glucose, Bld: 115 mg/dL — ABNORMAL HIGH (ref 65–99)
Potassium: 4.4 mmol/L (ref 3.5–5.1)
Sodium: 137 mmol/L (ref 135–145)

## 2016-02-20 LAB — GC/CHLAMYDIA PROBE AMP (~~LOC~~) NOT AT ARMC
Chlamydia: NEGATIVE
Neisseria Gonorrhea: NEGATIVE

## 2016-02-20 MED ORDER — DOXYCYCLINE HYCLATE 50 MG PO CAPS: 50.0000 mg | ORAL_CAPSULE | Freq: Two times a day (BID) | ORAL | 0 refills | Status: DC

## 2016-02-20 MED ORDER — OXYCODONE-ACETAMINOPHEN 5-325 MG PO TABS: 1.0000 | ORAL_TABLET | Freq: Four times a day (QID) | ORAL | 0 refills | Status: DC | PRN

## 2016-02-20 MED ORDER — PROMETHAZINE HCL 25 MG PO TABS
12.5000 mg | ORAL_TABLET | Freq: Four times a day (QID) | ORAL | Status: DC | PRN
  Administered 2016-02-20: 12.5 mg via ORAL
  Filled 2016-02-20: qty 1

## 2016-02-20 MED ORDER — FLUCONAZOLE 100 MG PO TABS: 100.0000 mg | ORAL_TABLET | Freq: Every day | ORAL | 0 refills | Status: AC

## 2016-02-20 MED ORDER — PROMETHAZINE HCL 25 MG/ML IJ SOLN
25.0000 mg | Freq: Four times a day (QID) | INTRAMUSCULAR | Status: DC | PRN
  Administered 2016-02-20: 25 mg via INTRAVENOUS
  Filled 2016-02-20: qty 1

## 2016-02-20 MED ORDER — PROMETHAZINE HCL 12.5 MG PO TABS: 12.5000 mg | ORAL_TABLET | Freq: Four times a day (QID) | ORAL | 0 refills | Status: DC | PRN

## 2016-02-20 NOTE — Progress Notes (Signed)
Patient ID: Laurie Warren, female   DOB: May 02, 1986, 30 y.o.   MRN: [id number]  [facility] Surgery Progress Note  1 Day Post-Op  Subjective: Had issues with nausea and pain last night. Added phenergan and doing somewhat better.   Objective: Vital signs in last 24 hours: Temp:  [98.1 F (36.7 C)-98.9 F (37.2 C)] 98.3 F (36.8 C) (08/25 0600) Pulse Rate:  [74-109] 74 (08/25 0600) Resp:  [13-18] 18 (08/25 0600) BP: (96-120)/(62-90) 101/64 (08/25 0600) SpO2:  [98 %-100 %] 100 % (08/25 0600) Weight:  [130 lb (59 kg)] 130 lb (59 kg) (08/24 1121) Last BM Date: 02/19/16  Intake/Output from previous day: 08/24 0701 - 08/25 0700 In: 800 [I.V.:800] Out: 1030 [Urine:1030] Intake/Output this shift: Total I/O In: -  Out: 400 [Urine:400]  PE: Gen:  Alert, NAD, pleasant Pulm:  CTAB Abd: Soft, NT/ND, +BS GU: swelling and tender left labia majora, packing in perivaginal abscess I&D incision  Lab Results:   Recent Labs  02/19/16 1301 02/20/16 0400  WBC 17.4* 13.4*  HGB 13.8 12.8  HCT 40.7 37.7  PLT 310 283   BMET  Recent Labs  02/19/16 1301 02/19/16 1324 02/20/16 0400  NA 137  --  137  K 3.5  --  4.4  CL 108  --  107  CO2 22  --  23  GLUCOSE 88  --  115*  BUN 8  --  11  CREATININE 0.57 0.60 0.65  CALCIUM 8.9  --  9.2   PT/INR No results for input(s): LABPROT, INR in the last 72 hours. CMP     Component Value Date/Time   NA 137 02/20/2016 0400   K 4.4 02/20/2016 0400   CL 107 02/20/2016 0400   CO2 23 02/20/2016 0400   GLUCOSE 115 (H) 02/20/2016 0400   BUN 11 02/20/2016 0400   CREATININE 0.65 02/20/2016 0400   CALCIUM 9.2 02/20/2016 0400   GFRNONAA >60 02/20/2016 0400   GFRAA >60 02/20/2016 0400   Lipase  No results found for: LIPASE     Studies/Results: Ct Abdomen Pelvis W Contrast  Result Date: 02/19/2016 CLINICAL DATA:  Painful perineal mass beginning 2 days ago, from LEFT vagina to LEFT buttock. Difficulty urinating. History of  cholecystectomy and abscess. EXAM: CT ABDOMEN AND PELVIS WITH CONTRAST TECHNIQUE: Multidetector CT imaging of the abdomen and pelvis was performed using the standard protocol following bolus administration of intravenous contrast. CONTRAST:  136mL ISOVUE-300 IOPAMIDOL (ISOVUE-300) INJECTION 61% COMPARISON:  None. FINDINGS: LUNG BASES: Included view of the lung bases are clear. Visualized heart and pericardium are unremarkable. SOLID ORGANS: The liver, spleen, pancreas and adrenal glands are unremarkable. Status post cholecystectomy. GASTROINTESTINAL TRACT: The stomach, small and large bowel are normal in course and caliber without inflammatory changes. Normal appendix. KIDNEYS/ URINARY TRACT: Kidneys are orthotopic, demonstrating symmetric enhancement. No nephrolithiasis, hydronephrosis or solid renal masses. The unopacified ureters are normal in course and caliber. Urinary bladder is partially distended and unremarkable. PERITONEUM/RETROPERITONEUM: Aortoiliac vessels are normal in course and caliber. No lymphadenopathy by CT size criteria. Internal reproductive organs are unremarkable. No intraperitoneal free fluid nor free air. Phleboliths in the pelvis. SOFT TISSUE/OSSEOUS STRUCTURES: LEFT perineal 17 x 34 x 22 mm rim enhancing fluid collection without subfascial extent, within 13 mm of skin surface. Minimal surrounding fat stranding without subcutaneous gas or radiopaque foreign bodies. IMPRESSION: 17 x 34 x 22 mm LEFT perivaginal/ perineal abscess . No acute intra-abdominal or pelvic process. Electronically Signed   By: Elon Alas  M.D.   On: 02/19/2016 14:27    Anti-infectives: Anti-infectives    Start     Dose/Rate Route Frequency Ordered Stop   02/20/16 1600  cefTRIAXone (ROCEPHIN) 2 g in dextrose 5 % 50 mL IVPB     2 g 100 mL/hr over 30 Minutes Intravenous Every 24 hours 02/19/16 1639     02/20/16 0700  azithromycin (ZITHROMAX) tablet 1,000 mg     1,000 mg Oral  Once 02/19/16 1548 02/19/16  1605   02/19/16 1600  cefTRIAXone (ROCEPHIN) 2 g in dextrose 5 % 50 mL IVPB     2 g 100 mL/hr over 30 Minutes Intravenous  Once 02/19/16 1547 02/19/16 1639       Assessment/Plan Incision and drainage of left vaginal/labial abscess 02/19/16 by Dr. Ninfa Linden - POD1 FEN - regular diet VTE - SCD's, heparin Plan - hope to discharge later today once pain is better under control. Will discharge on 10 days of doxycycline and follow-up in 2 weeks with Dr. Rush Farmer.     LOS: 0 days    Jerrye Beavers , [facility] Surgery 02/20/2016, 10:06 AM Pager: [phone] Consults: [phone] Mon-Fri 7:00 am-4:30 pm Sat-Sun 7:00 am-11:30 am

## 2016-02-20 NOTE — Discharge Summary (Signed)
[facility] Discharge Summary   Patient ID: Laurie Warren MRN: [id number] DOB/AGE: 1985/07/06 30 y.o.  Admit date: 02/19/2016 Discharge date: 02/20/2016  Admitting Diagnosis: Perineal abscess  Discharge Diagnosis Patient Active Problem List   Diagnosis Date Noted  . Abscess of left genital labia s/p I&D 02/19/2016 02/19/2016    Consultants None  Imaging: Ct Abdomen Pelvis W Contrast  Result Date: 02/19/2016 CLINICAL DATA:  Painful perineal mass beginning 2 days ago, from LEFT vagina to LEFT buttock. Difficulty urinating. History of cholecystectomy and abscess. EXAM: CT ABDOMEN AND PELVIS WITH CONTRAST TECHNIQUE: Multidetector CT imaging of the abdomen and pelvis was performed using the standard protocol following bolus administration of intravenous contrast. CONTRAST:  112mL ISOVUE-300 IOPAMIDOL (ISOVUE-300) INJECTION 61% COMPARISON:  None. FINDINGS: LUNG BASES: Included view of the lung bases are clear. Visualized heart and pericardium are unremarkable. SOLID ORGANS: The liver, spleen, pancreas and adrenal glands are unremarkable. Status post cholecystectomy. GASTROINTESTINAL TRACT: The stomach, small and large bowel are normal in course and caliber without inflammatory changes. Normal appendix. KIDNEYS/ URINARY TRACT: Kidneys are orthotopic, demonstrating symmetric enhancement. No nephrolithiasis, hydronephrosis or solid renal masses. The unopacified ureters are normal in course and caliber. Urinary bladder is partially distended and unremarkable. PERITONEUM/RETROPERITONEUM: Aortoiliac vessels are normal in course and caliber. No lymphadenopathy by CT size criteria. Internal reproductive organs are unremarkable. No intraperitoneal free fluid nor free air. Phleboliths in the pelvis. SOFT TISSUE/OSSEOUS STRUCTURES: LEFT perineal 17 x 34 x 22 mm rim enhancing fluid collection without subfascial extent, within 13 mm of skin surface. Minimal surrounding fat stranding without  subcutaneous gas or radiopaque foreign bodies. IMPRESSION: 17 x 34 x 22 mm LEFT perivaginal/ perineal abscess . No acute intra-abdominal or pelvic process. Electronically Signed   By: Elon Alas M.D.   On: 02/19/2016 14:27    Procedures Dr. Ninfa Linden (02/19/16) - [facility] Course:  Laurie Warren is a 30yo female who presented to [facility] 02/19/16 with left sided vaginal mass that appeared about 2 days prior.  Workup showed perineal/labial abscess.  Patient was admitted and underwent procedure listed above.  She received rocephin intraoperatively. Tolerated procedure well and was transferred to the floor.  Diet was advanced as tolerated.  On POD1 the patient was voiding well, tolerating diet, ambulating well, pain well controlled, vital signs stable, and felt stable for discharge home.  Packing was removed from abscess. Patient will follow up in our office in 2 weeks and knows to call with questions or concerns.    Physical Exam: Gen:  Alert, NAD, pleasant Pulm:  CTAB Abd: Soft, NT/ND, +BS GU: swelling and tender left labia majora, packing in perivaginal abscess I&D incision    Medication List    TAKE these medications   fluconazole 100 MG tablet Commonly known as:  DIFLUCAN Take 1 tablet (100 mg total) by mouth daily.   ibuprofen 200 MG tablet Commonly known as:  ADVIL,MOTRIN Take 800 mg by mouth every 6 (six) hours as needed for moderate pain.   oxyCODONE-acetaminophen 5-325 MG tablet Commonly known as:  PERCOCET/ROXICET Take 1 tablet by mouth every 6 (six) hours as needed for moderate pain.   promethazine 12.5 MG tablet Commonly known as:  PHENERGAN Take 1 tablet (12.5 mg total) by mouth every 6 (six) hours as needed for nausea or vomiting.        Follow-up Information    BLACKMAN,DOUGLAS A, MD. Go in 2 week(s).   Specialty:  General Surgery Why:  03/02/16 at 2:10pm, please arrive 30 minutes prior to appointment to fill out necessary  paperwork Contact information: [address] [place] [address] [id number]           Signed: Jerrye Beavers, [facility] Surgery 02/20/2016, 4:16 PM Pager: [phone] Consults: [phone] Mon-Fri 7:00 am-4:30 pm Sat-Sun 7:00 am-11:30 am

## 2016-02-20 NOTE — Op Note (Signed)
NAMECHARLOT, BOEPPLE NO.:  [id number]  MEDICAL RECORD NO.:  [id number]  LOCATION:  [id number]                         FACILITY:  [facility]  PHYSICIAN:  Coralie Keens, M.D. DATE OF BIRTH:  [birth year]  DATE OF PROCEDURE:  02/19/2016 DATE OF DISCHARGE:                              OPERATIVE REPORT   PREOPERATIVE DIAGNOSIS:  Left vaginal/labial abscess.  POSTOPERATIVE DIAGNOSIS:  Left vaginal/labial abscess.  PROCEDURE:  Incision and drainage of left vaginal/labial abscess.  SURGEON:  Coralie Keens, M.D.  ANESTHESIA:  General with 0.5% Marcaine with epinephrine.  ESTIMATED BLOOD LOSS:  Minimal.  FINDINGS:  The patient was found to have an abscess at the inner most portion of her lower labia on the left side as it was entering the vagina.  It do not appear to communicate with the rectum or anus.  PROCEDURE IN DETAIL:  The patient was brought to the operating room, identified as Laurie Warren.  She was placed supine on the operating room table and general anesthesia was induced.  The patient was then placed in lithotomy position and a Foley catheter was inserted.  Her perineum was then prepped and draped in usual sterile fashion.  The skin at the abscess site on the inner portion of the labia of the vagina was already eroding.  I anesthetized the skin around this area with Marcaine.  I then made an incision over the abscess area necrotic tissue with the scalpel.  I entered the abscess cavity and all the purulence was drained and I then irrigated with saline.  I then anesthetized further with Marcaine with epinephrine.  I then packed the abscess cavity with half- inch plain packing gauze.  The patient tolerated the procedure well. All the counts were correct at the end of the procedure.  The patient was then extubated in the operating room and taken in stable condition to recovery room.     Coralie Keens, M.D.     DB/MEDQ  D:  02/19/2016  T:  02/20/2016   Job:  [id number]

## 2016-02-20 NOTE — Discharge Instructions (Signed)
Incision and Drainage, Care After Refer to this sheet in the next few weeks. These instructions provide you with information on caring for yourself after your procedure. Your caregiver may also give you more specific instructions. Your treatment has been planned according to current medical practices, but problems sometimes occur. Call your caregiver if you have any problems or questions after your procedure. HOME CARE INSTRUCTIONS   If antibiotic medicine is given, take it as directed. Finish it even if you start to feel better.  Only take over-the-counter or prescription medicines for pain, discomfort, or fever as directed by your caregiver.  Keep all follow-up appointments as directed by your caregiver.  Change any bandages (dressings) as needed. Replace old dressings with clean dressings.  Wash your hands before and after caring for your wound.  Avoid soaking in bath tub until follow-up  Recommend not engaging in intercourse until follow-up [place] IF:   You have increased pain, swelling, or redness around the wound.  You have increased drainage, smell, or bleeding from the wound.  You have muscle aches, chills, or you feel generally sick.  You have a fever. MAKE SURE YOU:   Understand these instructions.  Will watch your condition.  Will get help right away if you are not doing well or get worse.   This information is not intended to replace advice given to you by your health care provider. Make sure you discuss any questions you have with your health care provider.   Document Released: 09/06/2011 Document Revised: 07/05/2014 Document Reviewed: 09/06/2011 Elsevier Interactive Patient Education [employer].

## 2016-02-20 NOTE — Progress Notes (Signed)
Patient given discharge instructions along with prescriptions. Antibiotics called to pharmacy by PA.  Patient aware questions answered.

## 2016-02-21 LAB — URINE CULTURE: Culture: <10000 — AB

## 2016-11-11 LAB — OB RESULTS CONSOLE HIV ANTIBODY (ROUTINE TESTING): HIV: NONREACTIVE

## 2016-11-11 LAB — OB RESULTS CONSOLE HEPATITIS B SURFACE ANTIGEN: Hepatitis B Surface Ag: NEGATIVE

## 2016-11-11 LAB — OB RESULTS CONSOLE GC/CHLAMYDIA
Chlamydia: NEGATIVE
GC PROBE AMP, GENITAL: NEGATIVE

## 2016-11-11 LAB — OB RESULTS CONSOLE ANTIBODY SCREEN: Antibody Screen: NEGATIVE

## 2016-11-11 LAB — OB RESULTS CONSOLE ABO/RH: RH Type: POSITIVE

## 2016-11-11 LAB — OB RESULTS CONSOLE RPR: RPR: NONREACTIVE

## 2016-11-11 LAB — OB RESULTS CONSOLE RUBELLA ANTIBODY, IGM: RUBELLA: IMMUNE

## 2017-05-16 LAB — OB RESULTS CONSOLE GBS: GBS: NEGATIVE

## 2017-05-27 ENCOUNTER — Encounter (HOSPITAL_COMMUNITY): Payer: Self-pay | Admitting: *Deleted

## 2017-05-30 ENCOUNTER — Encounter (HOSPITAL_COMMUNITY): Payer: Self-pay

## 2017-06-08 NOTE — H&P (Signed)
Laurie Warren is a 31 y.o. female G2P1001 at 39wk for rLTCS, initially desired TOLAC, but as pregnancy progressed changed to rLTCS.  Relatively uncomplicated PNC.  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 Received Tdap and Flu vaccines in [facility].  Also failed glucola, refused 3 hr - needed lantus to control sugars, couldn't tolerate metformin.    OB History    Gravida Para Term Preterm AB Living   2 1 1     1    SAB TAB Ectopic Multiple Live Births           1    G1 LTCS 9# G2 present, GDM  +abn pap -HGSIL with preg, colpo - f/u PP remore h/o Chl  Past Medical History:  Diagnosis Date  . ADD (attention deficit disorder)   . Gestational diabetes    taking lantus insulin  . PONV (postoperative nausea and vomiting)   . Sepsis ([place]) 2011   hx of  due to untreated bronchitis that was misdiagnosed  . Vaginal Pap smear, abnormal    Past Surgical History:  Procedure Laterality Date  . CESAREAN SECTION     8 years ago  . CHOLECYSTECTOMY    . IRRIGATION AND DEBRIDEMENT ABSCESS N/A 02/19/2016   Procedure: IRRIGATION AND DEBRIDEMENT LEFT LABIAL ABSCESS;  Surgeon: Coralie Keens, MD;  Location: [facility] ORS;  Service: General;  Laterality: N/A;  . TONSILLECTOMY     Family History: family history is noncontributory Social History:  reports that she quit smoking about 8 months ago. Her smoking use included cigarettes. She has a 7.50 pack-year smoking history. she has never used smokeless tobacco. She reports that she drinks alcohol. 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   HENT: Negative.   Eyes: Negative.   Respiratory: Negative.   Cardiovascular: Negative.   Gastrointestinal: Negative.   Genitourinary: Negative.   Musculoskeletal: Positive for back pain.  Skin: Negative.   Neurological: Negative.   Psychiatric/Behavioral: Negative.    Maternal Medical History:  Contractions: Frequency: irregular.    Fetal activity: Perceived fetal activity is normal.    Prenatal Complications - Diabetes: gestational. Diabetes is managed by insulin injections.        Last menstrual period 09/08/2016. Maternal Exam:  Uterine Assessment: Contraction strength is moderate.  Contraction frequency is irregular.   Abdomen: Patient reports no abdominal tenderness. Surgical scars: low transverse.   Fundal height is appropriate for gestation.   Estimated fetal weight is 8-8.5#.   Fetal presentation: vertex  Introitus: Normal vulva. Normal vagina.    Physical Exam  Constitutional: She is oriented to person, place, and time. She appears well-developed and well-nourished.  HENT:  Head: Normocephalic and atraumatic.  Cardiovascular: Normal rate and regular rhythm.  Respiratory: Effort normal and breath sounds normal. No respiratory distress. She has no wheezes.  GI: Soft. Bowel sounds are normal. She exhibits no distension. There is no tenderness.  Musculoskeletal: Normal range of motion.  Neurological: She is alert and oriented to person, place, and time.  Skin: Skin is warm and dry.  Psychiatric: She has a normal mood and affect. Her behavior is normal.    Prenatal labs: ABO, Rh: A/Positive/-- (05/17 0000) Antibody: Negative (05/17 0000) Rubella: Immune (05/17 0000) RPR: Nonreactive (05/17 0000)  HBsAg: Negative (05/17 0000)  HIV: Non-reactive (05/17 0000)  GBS: Negative (11/19 0000)   Tdap 9/26, Flu 10/10  Essential panel neg, First tri screen  neg/ Hgb 12.8/Plt 271/ GC neg/Chl neg/Varicella immune/ glucola 183, refused 3 hr - needed meds to control  Nl NT Nl anat, ant fundal plac, female ant fibroid Assessment/Plan: 31yo G2P1001 at 65 for rLTCS Ancef for prophylaxis GBBS neg - no abx D/w pt r/b/a, will proceed   Lindon Kiel Bovard-Stuckert 06/08/2017, 9:49 PM

## 2017-06-09 ENCOUNTER — Encounter (HOSPITAL_COMMUNITY)
Admission: RE | Admit: 2017-06-09 | Discharge: 2017-06-09 | Disposition: A | Discharge status: Home / Self Care | Payer: BLUE CROSS/BLUE SHIELD | Source: Ambulatory Visit | Attending: Obstetrics and Gynecology | Admitting: Obstetrics and Gynecology

## 2017-06-09 HISTORY — DX: Unspecified abnormal cytological findings in specimens from vagina: R87.629

## 2017-06-09 HISTORY — DX: Gestational diabetes mellitus in pregnancy, unspecified control: O24.419

## 2017-06-09 HISTORY — DX: Other specified behavioral and emotional disorders with onset usually occurring in childhood and adolescence: F98.8

## 2017-06-09 LAB — CBC
HCT: 37.5 % (ref 36.0–46.0)
Hemoglobin: 12.3 g/dL (ref 12.0–15.0)
MCH: 31 pg (ref 26.0–34.0)
MCHC: 32.8 g/dL (ref 30.0–36.0)
MCV: 94.5 fL (ref 78.0–100.0)
PLATELETS: 225 10*3/uL (ref 150–400)
RBC: 3.97 MIL/uL (ref 3.87–5.11)
RDW: 14.6 % (ref 11.5–15.5)
WBC: 10.5 10*3/uL (ref 4.0–10.5)

## 2017-06-09 LAB — ABO/RH: ABO/RH(D): A POS

## 2017-06-09 NOTE — Patient Instructions (Addendum)
Laurie Warren  06/09/2017   Your procedure is scheduled on:  06/10/2017  Enter through the Main Entrance of [facility] at [place] up the phone at the desk and dial [phone]  Call this number if you have problems the morning of [phone]  Remember:   Do not eat food:After Midnight.  Do not drink clear liquids: After Midnight.  Take these medicines the morning of surgery with A SIP OF WATER: take 8u Lantus insulin at bedtime.  No medications the day of surgery   Do not wear jewelry, make-up or nail polish.  Do not wear lotions, powders, or perfumes. Do not wear deodorant.  Do not shave 48 hours prior to surgery.  Do not bring valuables to the hospital.  [facility] is not   responsible for any belongings or valuables brought to the hospital.  Contacts, dentures or bridgework may not be worn into surgery.  Leave suitcase in the car. After surgery it may be brought to your room.  For patients admitted to the hospital, checkout time is 11:00 AM the day of              discharge.    N/A   Please read over the following fact sheets that you were given:   Surgical Site Infection Prevention

## 2017-06-10 ENCOUNTER — Encounter (HOSPITAL_COMMUNITY)
Admission: RE | Disposition: A | Discharge status: Home / Self Care | Payer: Self-pay | Source: Ambulatory Visit | Attending: Obstetrics and Gynecology

## 2017-06-10 ENCOUNTER — Inpatient Hospital Stay (HOSPITAL_COMMUNITY): Payer: BLUE CROSS/BLUE SHIELD | Admitting: Anesthesiology

## 2017-06-10 ENCOUNTER — Inpatient Hospital Stay (HOSPITAL_COMMUNITY): Payer: BLUE CROSS/BLUE SHIELD | Admitting: Certified Registered Nurse Anesthetist

## 2017-06-10 ENCOUNTER — Other Ambulatory Visit: Payer: Self-pay

## 2017-06-10 ENCOUNTER — Encounter (HOSPITAL_COMMUNITY): Payer: Self-pay

## 2017-06-10 ENCOUNTER — Inpatient Hospital Stay (HOSPITAL_COMMUNITY)
Admission: RE | Admit: 2017-06-10 | Discharge: 2017-06-13 | DRG: 787 | Disposition: A | Discharge status: Home / Self Care | Payer: BLUE CROSS/BLUE SHIELD | Source: Ambulatory Visit | Attending: Obstetrics and Gynecology | Admitting: Obstetrics and Gynecology

## 2017-06-10 DIAGNOSIS — Z3483 Encounter for supervision of other normal pregnancy, third trimester: Secondary | ICD-10-CM

## 2017-06-10 DIAGNOSIS — Z87891 Personal history of nicotine dependence: Secondary | ICD-10-CM

## 2017-06-10 DIAGNOSIS — D259 Leiomyoma of uterus, unspecified: Secondary | ICD-10-CM | POA: Diagnosis present

## 2017-06-10 DIAGNOSIS — O3413 Maternal care for benign tumor of corpus uteri, third trimester: Secondary | ICD-10-CM | POA: Diagnosis present

## 2017-06-10 DIAGNOSIS — O24424 Gestational diabetes mellitus in childbirth, insulin controlled: Secondary | ICD-10-CM | POA: Diagnosis present

## 2017-06-10 DIAGNOSIS — O34211 Maternal care for low transverse scar from previous cesarean delivery: Secondary | ICD-10-CM | POA: Diagnosis present

## 2017-06-10 DIAGNOSIS — Z3A39 39 weeks gestation of pregnancy: Secondary | ICD-10-CM

## 2017-06-10 DIAGNOSIS — Z98891 History of uterine scar from previous surgery: Secondary | ICD-10-CM

## 2017-06-10 HISTORY — DX: History of uterine scar from previous surgery: Z98.891

## 2017-06-10 HISTORY — PX: DILATION AND CURETTAGE OF UTERUS: SHX78

## 2017-06-10 LAB — CBC
HCT: 26.3 % — ABNORMAL LOW (ref 36.0–46.0)
HEMATOCRIT: 34.4 % — AB (ref 36.0–46.0)
HEMATOCRIT: 29.2 % — AB (ref 36.0–46.0)
HEMOGLOBIN: 11.3 g/dL — AB (ref 12.0–15.0)
HEMOGLOBIN: 9.8 g/dL — AB (ref 12.0–15.0)
Hemoglobin: 8.4 g/dL — ABNORMAL LOW (ref 12.0–15.0)
MCH: 31 pg (ref 26.0–34.0)
MCH: 30.7 pg (ref 26.0–34.0)
MCH: 31.2 pg (ref 26.0–34.0)
MCHC: 32.8 g/dL (ref 30.0–36.0)
MCHC: 31.9 g/dL (ref 30.0–36.0)
MCHC: 33.6 g/dL (ref 30.0–36.0)
MCV: 94.5 fL (ref 78.0–100.0)
MCV: 96 fL (ref 78.0–100.0)
MCV: 93 fL (ref 78.0–100.0)
PLATELETS: 280 10*3/uL (ref 150–400)
Platelets: 219 10*3/uL (ref 150–400)
Platelets: 159 10*3/uL (ref 150–400)
RBC: 3.64 MIL/uL — AB (ref 3.87–5.11)
RBC: 2.74 MIL/uL — AB (ref 3.87–5.11)
RBC: 3.14 MIL/uL — AB (ref 3.87–5.11)
RDW: 14.5 % (ref 11.5–15.5)
RDW: 14.6 % (ref 11.5–15.5)
RDW: 14.2 % (ref 11.5–15.5)
WBC: 13.8 10*3/uL — ABNORMAL HIGH (ref 4.0–10.5)
WBC: 24 10*3/uL — ABNORMAL HIGH (ref 4.0–10.5)
WBC: 17.9 10*3/uL — ABNORMAL HIGH (ref 4.0–10.5)

## 2017-06-10 LAB — PREPARE RBC (CROSSMATCH)

## 2017-06-10 LAB — APTT: aPTT: 25 seconds (ref 24–36)

## 2017-06-10 LAB — GLUCOSE, CAPILLARY
GLUCOSE-CAPILLARY: 88 mg/dL (ref 65–99)
GLUCOSE-CAPILLARY: 99 mg/dL (ref 65–99)
Glucose-Capillary: 92 mg/dL (ref 65–99)

## 2017-06-10 LAB — PROTIME-INR
INR: 1.04
PROTHROMBIN TIME: 13.5 s (ref 11.4–15.2)

## 2017-06-10 LAB — POSTPARTUM HEMORRHAGE PROTOCOL (BB NOTIFICATION)

## 2017-06-10 LAB — RPR: RPR: NONREACTIVE

## 2017-06-10 LAB — FIBRINOGEN: Fibrinogen: 355 mg/dL (ref 210–475)

## 2017-06-10 SURGERY — DILATION AND CURETTAGE
Anesthesia: General

## 2017-06-10 SURGERY — Surgical Case
Anesthesia: Spinal | Site: Abdomen | Wound class: Clean Contaminated

## 2017-06-10 MED ORDER — HYDROMORPHONE HCL 1 MG/ML IJ SOLN
0.2500 mg | INTRAMUSCULAR | Status: DC | PRN
  Administered 2017-06-10: 0.5 mg via INTRAVENOUS

## 2017-06-10 MED ORDER — SUCCINYLCHOLINE CHLORIDE 200 MG/10ML IV SOSY
PREFILLED_SYRINGE | INTRAVENOUS | Status: AC
  Filled 2017-06-10: qty 10

## 2017-06-10 MED ORDER — ONDANSETRON HCL 4 MG/2ML IJ SOLN
INTRAMUSCULAR | Status: DC | PRN
  Administered 2017-06-10: 4 mg via INTRAVENOUS

## 2017-06-10 MED ORDER — PROPOFOL 10 MG/ML IV BOLUS
INTRAVENOUS | Status: AC
  Filled 2017-06-10: qty 20

## 2017-06-10 MED ORDER — MISOPROSTOL 200 MCG PO TABS
ORAL_TABLET | ORAL | Status: AC
  Filled 2017-06-10: qty 4

## 2017-06-10 MED ORDER — PHENYLEPHRINE HCL 10 MG/ML IJ SOLN
INTRAMUSCULAR | Status: DC | PRN
  Administered 2017-06-10: 120 ug via INTRAVENOUS
  Administered 2017-06-10 (×2): 80 ug via INTRAVENOUS
  Administered 2017-06-10: 120 ug via INTRAVENOUS
  Administered 2017-06-10: 160 ug via INTRAVENOUS
  Administered 2017-06-10 (×3): 80 ug via INTRAVENOUS
  Administered 2017-06-10: 120 ug via INTRAVENOUS

## 2017-06-10 MED ORDER — DIPHENHYDRAMINE HCL 25 MG PO CAPS: 25.0000 mg | ORAL_CAPSULE | Freq: Four times a day (QID) | ORAL | Status: DC | PRN

## 2017-06-10 MED ORDER — BUPIVACAINE IN DEXTROSE 0.75-8.25 % IT SOLN
INTRATHECAL | Status: DC | PRN
  Administered 2017-06-10: 1.6 mL via INTRATHECAL

## 2017-06-10 MED ORDER — OXYCODONE HCL 5 MG PO TABS: 5.0000 mg | ORAL_TABLET | Freq: Once | ORAL | Status: DC | PRN

## 2017-06-10 MED ORDER — PHENYLEPHRINE 8 MG IN D5W 100 ML (0.08MG/ML) PREMIX OPTIME
INJECTION | INTRAVENOUS | Status: DC | PRN
  Administered 2017-06-10: 60 ug/min via INTRAVENOUS

## 2017-06-10 MED ORDER — SODIUM CHLORIDE 0.9% FLUSH: 3.0000 mL | INTRAVENOUS | Status: DC | PRN

## 2017-06-10 MED ORDER — DIPHENHYDRAMINE HCL 25 MG PO CAPS
25.0000 mg | ORAL_CAPSULE | ORAL | Status: DC | PRN
  Filled 2017-06-10: qty 1

## 2017-06-10 MED ORDER — PHENYLEPHRINE 40 MCG/ML (10ML) SYRINGE FOR IV PUSH (FOR BLOOD PRESSURE SUPPORT)
PREFILLED_SYRINGE | INTRAVENOUS | Status: AC
Start: 2017-06-10 — End: ?
  Filled 2017-06-10: qty 10

## 2017-06-10 MED ORDER — PROMETHAZINE HCL 25 MG/ML IJ SOLN: 6.2500 mg | INTRAMUSCULAR | Status: DC | PRN

## 2017-06-10 MED ORDER — LIDOCAINE HCL (CARDIAC) 20 MG/ML IV SOLN
INTRAVENOUS | Status: DC | PRN
  Administered 2017-06-10: 40 mg via INTRAVENOUS

## 2017-06-10 MED ORDER — MIDAZOLAM HCL 2 MG/2ML IJ SOLN
INTRAMUSCULAR | Status: DC | PRN
  Administered 2017-06-10: 2 mg via INTRAVENOUS

## 2017-06-10 MED ORDER — ZOLPIDEM TARTRATE 5 MG PO TABS: 5.0000 mg | ORAL_TABLET | Freq: Every evening | ORAL | Status: DC | PRN

## 2017-06-10 MED ORDER — SIMETHICONE 80 MG PO CHEW
80.0000 mg | CHEWABLE_TABLET | ORAL | Status: DC
  Administered 2017-06-11 – 2017-06-12 (×3): 80 mg via ORAL
  Filled 2017-06-10 (×3): qty 1

## 2017-06-10 MED ORDER — SODIUM CHLORIDE 0.9 % IV SOLN: Freq: Once | INTRAVENOUS | Status: DC

## 2017-06-10 MED ORDER — ONDANSETRON HCL 4 MG/2ML IJ SOLN
INTRAMUSCULAR | Status: AC
  Filled 2017-06-10: qty 2

## 2017-06-10 MED ORDER — SENNOSIDES-DOCUSATE SODIUM 8.6-50 MG PO TABS
2.0000 | ORAL_TABLET | ORAL | Status: DC
  Administered 2017-06-11 – 2017-06-12 (×3): 2 via ORAL
  Filled 2017-06-10 (×3): qty 2

## 2017-06-10 MED ORDER — CEFAZOLIN SODIUM-DEXTROSE 2-3 GM-%(50ML) IV SOLR
INTRAVENOUS | Status: AC
Start: 2017-06-10 — End: ?
  Filled 2017-06-10: qty 50

## 2017-06-10 MED ORDER — SUCCINYLCHOLINE CHLORIDE 20 MG/ML IJ SOLN
INTRAMUSCULAR | Status: DC | PRN
  Administered 2017-06-10: 120 mg via INTRAVENOUS

## 2017-06-10 MED ORDER — OXYTOCIN 10 UNIT/ML IJ SOLN
INTRAMUSCULAR | Status: DC | PRN
  Administered 2017-06-10: 40 [IU] via INTRAMUSCULAR

## 2017-06-10 MED ORDER — METHYLERGONOVINE MALEATE 0.2 MG/ML IJ SOLN
INTRAMUSCULAR | Status: AC
  Administered 2017-06-10: 0.2 mg via INTRAMUSCULAR
  Filled 2017-06-10: qty 1

## 2017-06-10 MED ORDER — MISOPROSTOL 200 MCG PO TABS
800.0000 ug | ORAL_TABLET | Freq: Once | ORAL | Status: AC
  Administered 2017-06-10: 800 ug via ORAL

## 2017-06-10 MED ORDER — KETOROLAC TROMETHAMINE 30 MG/ML IJ SOLN: 30.0000 mg | Freq: Once | INTRAMUSCULAR | Status: DC | PRN

## 2017-06-10 MED ORDER — PROPOFOL 10 MG/ML IV BOLUS
INTRAVENOUS | Status: DC | PRN
  Administered 2017-06-10: 150 mg via INTRAVENOUS
  Administered 2017-06-10: 50 mg via INTRAVENOUS

## 2017-06-10 MED ORDER — PHENYLEPHRINE 8 MG IN D5W 100 ML (0.08MG/ML) PREMIX OPTIME
INJECTION | INTRAVENOUS | Status: AC
  Filled 2017-06-10: qty 100

## 2017-06-10 MED ORDER — DEXAMETHASONE SODIUM PHOSPHATE 10 MG/ML IJ SOLN
INTRAMUSCULAR | Status: DC | PRN
  Administered 2017-06-10: 4 mg via INTRAVENOUS

## 2017-06-10 MED ORDER — CEFAZOLIN SODIUM-DEXTROSE 2-3 GM-%(50ML) IV SOLR
INTRAVENOUS | Status: DC | PRN
  Administered 2017-06-10: 2 g via INTRAVENOUS

## 2017-06-10 MED ORDER — MISOPROSTOL 200 MCG PO TABS
ORAL_TABLET | ORAL | Status: AC
  Filled 2017-06-10: qty 5

## 2017-06-10 MED ORDER — PROMETHAZINE HCL 25 MG/ML IJ SOLN
25.0000 mg | Freq: Four times a day (QID) | INTRAMUSCULAR | Status: DC | PRN
  Administered 2017-06-11: 25 mg via INTRAVENOUS
  Filled 2017-06-10 (×2): qty 1

## 2017-06-10 MED ORDER — FENTANYL CITRATE (PF) 100 MCG/2ML IJ SOLN
INTRAMUSCULAR | Status: DC | PRN
  Administered 2017-06-10: 100 ug via INTRAVENOUS
  Administered 2017-06-10: 50 ug via INTRAVENOUS

## 2017-06-10 MED ORDER — OXYTOCIN 40 UNITS IN LACTATED RINGERS INFUSION - SIMPLE MED
INTRAVENOUS | Status: AC
  Filled 2017-06-10: qty 1000

## 2017-06-10 MED ORDER — SCOPOLAMINE 1 MG/3DAYS TD PT72: 1.0000 | MEDICATED_PATCH | Freq: Once | TRANSDERMAL | Status: DC

## 2017-06-10 MED ORDER — PRENATAL MULTIVITAMIN CH
1.0000 | ORAL_TABLET | Freq: Every day | ORAL | Status: DC
  Administered 2017-06-11: 1 via ORAL
  Filled 2017-06-10 (×3): qty 1

## 2017-06-10 MED ORDER — DIPHENHYDRAMINE HCL 50 MG/ML IJ SOLN
INTRAMUSCULAR | Status: DC | PRN
  Administered 2017-06-10: 25 mg via INTRAVENOUS

## 2017-06-10 MED ORDER — SIMETHICONE 80 MG PO CHEW: 80.0000 mg | CHEWABLE_TABLET | ORAL | Status: DC | PRN

## 2017-06-10 MED ORDER — CARBOPROST TROMETHAMINE 250 MCG/ML IM SOLN
INTRAMUSCULAR | Status: AC
  Filled 2017-06-10: qty 1

## 2017-06-10 MED ORDER — METHYLERGONOVINE MALEATE 0.2 MG/ML IJ SOLN
INTRAMUSCULAR | Status: AC
  Filled 2017-06-10: qty 3

## 2017-06-10 MED ORDER — NALBUPHINE HCL 10 MG/ML IJ SOLN: 5.0000 mg | INTRAMUSCULAR | Status: DC | PRN

## 2017-06-10 MED ORDER — IBUPROFEN 800 MG PO TABS
800.0000 mg | ORAL_TABLET | Freq: Three times a day (TID) | ORAL | Status: DC
  Administered 2017-06-12 – 2017-06-13 (×3): 800 mg via ORAL
  Filled 2017-06-10 (×3): qty 1

## 2017-06-10 MED ORDER — PHENYLEPHRINE 8 MG IN D5W 100 ML (0.08MG/ML) PREMIX OPTIME
INJECTION | INTRAVENOUS | Status: DC | PRN
  Administered 2017-06-10: 100 ug/min via INTRAVENOUS

## 2017-06-10 MED ORDER — OXYTOCIN 10 UNIT/ML IJ SOLN
INTRAMUSCULAR | Status: AC
  Filled 2017-06-10: qty 4

## 2017-06-10 MED ORDER — LACTATED RINGERS IV SOLN
INTRAVENOUS | Status: DC | PRN
  Administered 2017-06-10: 14:00:00 via INTRAVENOUS

## 2017-06-10 MED ORDER — FENTANYL CITRATE (PF) 100 MCG/2ML IJ SOLN
INTRAMUSCULAR | Status: AC
  Filled 2017-06-10: qty 2

## 2017-06-10 MED ORDER — GLYCOPYRROLATE 0.2 MG/ML IJ SOLN
INTRAMUSCULAR | Status: AC
  Filled 2017-06-10: qty 4

## 2017-06-10 MED ORDER — DIPHENHYDRAMINE HCL 50 MG/ML IJ SOLN
INTRAMUSCULAR | Status: AC
  Filled 2017-06-10: qty 1

## 2017-06-10 MED ORDER — PHENYLEPHRINE 8 MG IN D5W 100 ML (0.08MG/ML) PREMIX OPTIME
INJECTION | INTRAVENOUS | Status: AC
Start: 2017-06-10 — End: ?
  Filled 2017-06-10: qty 100

## 2017-06-10 MED ORDER — LACTATED RINGERS IV SOLN
INTRAVENOUS | Status: DC
  Administered 2017-06-10 – 2017-06-11 (×4): via INTRAVENOUS

## 2017-06-10 MED ORDER — CEFAZOLIN SODIUM-DEXTROSE 2-4 GM/100ML-% IV SOLN
2.0000 g | INTRAVENOUS | Status: AC
  Administered 2017-06-10: 2 g via INTRAVENOUS
  Filled 2017-06-10: qty 100

## 2017-06-10 MED ORDER — COCONUT OIL OIL
1.0000 | TOPICAL_OIL | Status: DC | PRN
Start: 2017-06-10 — End: 2017-06-13
  Administered 2017-06-11: 1 via TOPICAL
  Filled 2017-06-10: qty 120

## 2017-06-10 MED ORDER — DEXAMETHASONE SODIUM PHOSPHATE 4 MG/ML IJ SOLN
INTRAMUSCULAR | Status: DC | PRN
  Administered 2017-06-10: 4 mg via INTRAVENOUS

## 2017-06-10 MED ORDER — SODIUM CHLORIDE 0.9 % IV SOLN
INTRAVENOUS | Status: DC | PRN
  Administered 2017-06-10 (×2): via INTRAVENOUS

## 2017-06-10 MED ORDER — PHENYLEPHRINE HCL 10 MG/ML IJ SOLN
INTRAMUSCULAR | Status: DC | PRN
  Administered 2017-06-10 (×2): 80 ug via INTRAVENOUS

## 2017-06-10 MED ORDER — NALBUPHINE HCL 10 MG/ML IJ SOLN: 5.0000 mg | Freq: Once | INTRAMUSCULAR | Status: DC | PRN

## 2017-06-10 MED ORDER — MIDAZOLAM HCL 2 MG/2ML IJ SOLN: INTRAMUSCULAR | Status: DC | PRN

## 2017-06-10 MED ORDER — LACTATED RINGERS IV SOLN
INTRAVENOUS | Status: DC
  Administered 2017-06-10 (×2): via INTRAVENOUS

## 2017-06-10 MED ORDER — ONDANSETRON HCL 4 MG/2ML IJ SOLN
INTRAMUSCULAR | Status: AC
Start: 2017-06-10 — End: ?
  Filled 2017-06-10: qty 2

## 2017-06-10 MED ORDER — ONDANSETRON HCL 4 MG/2ML IJ SOLN
4.0000 mg | Freq: Once | INTRAMUSCULAR | Status: AC
  Administered 2017-06-10: 4 mg via INTRAVENOUS
  Filled 2017-06-10: qty 2

## 2017-06-10 MED ORDER — OXYCODONE HCL 5 MG PO TABS
5.0000 mg | ORAL_TABLET | ORAL | Status: DC | PRN
  Administered 2017-06-12 – 2017-06-13 (×3): 5 mg via ORAL
  Filled 2017-06-10 (×2): qty 1

## 2017-06-10 MED ORDER — BUPIVACAINE HCL (PF) 0.75 % IJ SOLN: INTRAMUSCULAR | Status: DC | PRN

## 2017-06-10 MED ORDER — LIDOCAINE HCL (CARDIAC) 20 MG/ML IV SOLN
INTRAVENOUS | Status: AC
  Filled 2017-06-10: qty 5

## 2017-06-10 MED ORDER — MORPHINE SULFATE (PF) 0.5 MG/ML IJ SOLN
INTRAMUSCULAR | Status: AC
  Filled 2017-06-10: qty 10

## 2017-06-10 MED ORDER — DIPHENHYDRAMINE HCL 25 MG PO CAPS: 25.0000 mg | ORAL_CAPSULE | Freq: Once | ORAL | Status: DC

## 2017-06-10 MED ORDER — SCOPOLAMINE 1 MG/3DAYS TD PT72
MEDICATED_PATCH | TRANSDERMAL | Status: DC | PRN
  Administered 2017-06-10: 1 via TRANSDERMAL

## 2017-06-10 MED ORDER — OXYCODONE HCL 5 MG PO TABS
10.0000 mg | ORAL_TABLET | ORAL | Status: DC | PRN
  Administered 2017-06-11 – 2017-06-12 (×5): 10 mg via ORAL
  Filled 2017-06-10 (×6): qty 2

## 2017-06-10 MED ORDER — ACETAMINOPHEN 325 MG PO TABS
650.0000 mg | ORAL_TABLET | ORAL | Status: DC | PRN
  Administered 2017-06-12 – 2017-06-13 (×4): 650 mg via ORAL
  Filled 2017-06-10 (×4): qty 2

## 2017-06-10 MED ORDER — MEPERIDINE HCL 25 MG/ML IJ SOLN: 6.2500 mg | INTRAMUSCULAR | Status: DC | PRN

## 2017-06-10 MED ORDER — MENTHOL 3 MG MT LOZG: 1.0000 | LOZENGE | OROMUCOSAL | Status: DC | PRN

## 2017-06-10 MED ORDER — SODIUM CHLORIDE 0.9 % IR SOLN
Status: DC | PRN
  Administered 2017-06-10: 1000 mL

## 2017-06-10 MED ORDER — HYDROMORPHONE HCL 1 MG/ML IJ SOLN
INTRAMUSCULAR | Status: AC
  Administered 2017-06-11: 1 mg via INTRAVENOUS
  Filled 2017-06-10: qty 0.5

## 2017-06-10 MED ORDER — MIDAZOLAM HCL 2 MG/2ML IJ SOLN
INTRAMUSCULAR | Status: AC
  Filled 2017-06-10: qty 2

## 2017-06-10 MED ORDER — OXYCODONE HCL 5 MG/5ML PO SOLN: 5.0000 mg | Freq: Once | ORAL | Status: DC | PRN

## 2017-06-10 MED ORDER — DIBUCAINE 1 % RE OINT: 1.0000 "application " | TOPICAL_OINTMENT | RECTAL | Status: DC | PRN

## 2017-06-10 MED ORDER — LACTATED RINGERS IV SOLN
INTRAVENOUS | Status: DC | PRN
  Administered 2017-06-10: 08:00:00 via INTRAVENOUS

## 2017-06-10 MED ORDER — FENTANYL CITRATE (PF) 100 MCG/2ML IJ SOLN
INTRAMUSCULAR | Status: DC | PRN
  Administered 2017-06-10: 10 ug via INTRATHECAL

## 2017-06-10 MED ORDER — DEXAMETHASONE SODIUM PHOSPHATE 10 MG/ML IJ SOLN
INTRAMUSCULAR | Status: AC
  Filled 2017-06-10: qty 1

## 2017-06-10 MED ORDER — HYDROMORPHONE HCL 1 MG/ML IJ SOLN: 0.2500 mg | INTRAMUSCULAR | Status: DC | PRN

## 2017-06-10 MED ORDER — METHYLERGONOVINE MALEATE 0.2 MG/ML IJ SOLN
0.2000 mg | Freq: Once | INTRAMUSCULAR | Status: AC
  Administered 2017-06-10: 0.2 mg via INTRAMUSCULAR

## 2017-06-10 MED ORDER — OXYTOCIN 40 UNITS IN LACTATED RINGERS INFUSION - SIMPLE MED: 2.5000 [IU]/h | INTRAVENOUS | Status: AC

## 2017-06-10 MED ORDER — OXYTOCIN 10 UNIT/ML IJ SOLN
INTRAVENOUS | Status: DC | PRN
  Administered 2017-06-10: 40 [IU] via INTRAVENOUS

## 2017-06-10 MED ORDER — PHENYLEPHRINE 40 MCG/ML (10ML) SYRINGE FOR IV PUSH (FOR BLOOD PRESSURE SUPPORT)
PREFILLED_SYRINGE | INTRAVENOUS | Status: AC
  Filled 2017-06-10: qty 10

## 2017-06-10 MED ORDER — OXYCODONE HCL 5 MG PO TABS
5.0000 mg | ORAL_TABLET | Freq: Once | ORAL | Status: DC | PRN
Start: 2017-06-10 — End: 2017-06-10

## 2017-06-10 MED ORDER — ACETAMINOPHEN 325 MG PO TABS: 650.0000 mg | ORAL_TABLET | Freq: Once | ORAL | Status: DC

## 2017-06-10 MED ORDER — FENTANYL CITRATE (PF) 100 MCG/2ML IJ SOLN
INTRAMUSCULAR | Status: AC
  Administered 2017-06-10: 100 ug
  Filled 2017-06-10: qty 4

## 2017-06-10 MED ORDER — HYDROMORPHONE HCL 1 MG/ML IJ SOLN
1.0000 mg | Freq: Once | INTRAMUSCULAR | Status: AC
  Administered 2017-06-10: 1 mg via INTRAVENOUS
  Filled 2017-06-10: qty 1

## 2017-06-10 MED ORDER — WITCH HAZEL-GLYCERIN EX PADS: 1.0000 "application " | MEDICATED_PAD | CUTANEOUS | Status: DC | PRN

## 2017-06-10 MED ORDER — SIMETHICONE 80 MG PO CHEW
80.0000 mg | CHEWABLE_TABLET | Freq: Three times a day (TID) | ORAL | Status: DC
  Administered 2017-06-11 – 2017-06-13 (×6): 80 mg via ORAL
  Filled 2017-06-10 (×6): qty 1

## 2017-06-10 MED ORDER — LACTATED RINGERS IV BOLUS (SEPSIS)
500.0000 mL | Freq: Once | INTRAVENOUS | Status: AC
  Administered 2017-06-10: 500 mL via INTRAVENOUS

## 2017-06-10 MED ORDER — NALOXONE HCL 0.4 MG/ML IJ SOLN
1.0000 ug/kg/h | INTRAVENOUS | Status: DC | PRN
  Filled 2017-06-10: qty 5

## 2017-06-10 MED ORDER — NALOXONE HCL 0.4 MG/ML IJ SOLN: 0.4000 mg | INTRAMUSCULAR | Status: DC | PRN

## 2017-06-10 MED ORDER — ONDANSETRON HCL 4 MG/2ML IJ SOLN
4.0000 mg | Freq: Three times a day (TID) | INTRAMUSCULAR | Status: DC | PRN
Start: 2017-06-10 — End: 2017-06-13
  Administered 2017-06-10 – 2017-06-12 (×4): 4 mg via INTRAVENOUS
  Filled 2017-06-10 (×5): qty 2

## 2017-06-10 MED ORDER — DIPHENHYDRAMINE HCL 50 MG/ML IJ SOLN: 12.5000 mg | INTRAMUSCULAR | Status: DC | PRN

## 2017-06-10 MED ORDER — MORPHINE SULFATE (PF) 0.5 MG/ML IJ SOLN
INTRAMUSCULAR | Status: DC | PRN
  Administered 2017-06-10: .2 mg via INTRATHECAL

## 2017-06-10 MED ORDER — FENTANYL CITRATE (PF) 250 MCG/5ML IJ SOLN
INTRAMUSCULAR | Status: AC
  Filled 2017-06-10: qty 5

## 2017-06-10 SURGICAL SUPPLY — 37 items
BENZOIN TINCTURE PRP APPL 2/3 (GAUZE/BANDAGES/DRESSINGS) ×3 IMPLANT
CHLORAPREP W/TINT 26ML (MISCELLANEOUS) ×3 IMPLANT
CLAMP CORD UMBIL (MISCELLANEOUS) IMPLANT
CLOSURE STERI STRIP 1/2 X4 (GAUZE/BANDAGES/DRESSINGS) ×3 IMPLANT
CLOTH BEACON ORANGE TIMEOUT ST (SAFETY) ×3 IMPLANT
CONTAINER PREFILL 10% NBF 15ML (MISCELLANEOUS) IMPLANT
DRSG OPSITE POSTOP 4X10 (GAUZE/BANDAGES/DRESSINGS) ×3 IMPLANT
ELECT REM PT RETURN 9FT ADLT (ELECTROSURGICAL) ×3
ELECTRODE REM PT RTRN 9FT ADLT (ELECTROSURGICAL) ×1 IMPLANT
EXTRACTOR VACUUM M CUP 4 TUBE (SUCTIONS) IMPLANT
EXTRACTOR VACUUM M CUP 4' TUBE (SUCTIONS)
GLOVE BIO SURGEON STRL SZ 6.5 (GLOVE) ×2 IMPLANT
GLOVE BIO SURGEONS STRL SZ 6.5 (GLOVE) ×1
GLOVE BIOGEL PI IND STRL 7.0 (GLOVE) ×1 IMPLANT
GLOVE BIOGEL PI INDICATOR 7.0 (GLOVE) ×2
GOWN STRL REUS W/TWL LRG LVL3 (GOWN DISPOSABLE) ×6 IMPLANT
KIT ABG SYR 3ML LUER SLIP (SYRINGE) IMPLANT
NEEDLE HYPO 25X5/8 SAFETYGLIDE (NEEDLE) IMPLANT
NS IRRIG 1000ML POUR BTL (IV SOLUTION) ×3 IMPLANT
PACK C SECTION WH (CUSTOM PROCEDURE TRAY) ×3 IMPLANT
PAD OB MATERNITY 4.3X12.25 (PERSONAL CARE ITEMS) ×3 IMPLANT
PENCIL SMOKE EVAC W/HOLSTER (ELECTROSURGICAL) ×3 IMPLANT
RTRCTR C-SECT PINK 25CM LRG (MISCELLANEOUS) ×3 IMPLANT
SPONGE LAP 18X18 X RAY DECT (DISPOSABLE) ×3 IMPLANT
STRIP CLOSURE SKIN 1/2X4 (GAUZE/BANDAGES/DRESSINGS) ×2 IMPLANT
SUT MNCRL 0 VIOLET CTX 36 (SUTURE) ×2 IMPLANT
SUT MONOCRYL 0 CTX 36 (SUTURE) ×4
SUT PLAIN 1 NONE 54 (SUTURE) IMPLANT
SUT PLAIN 2 0 XLH (SUTURE) ×3 IMPLANT
SUT VIC AB 0 CT1 27 (SUTURE) ×4
SUT VIC AB 0 CT1 27XBRD ANBCTR (SUTURE) ×2 IMPLANT
SUT VIC AB 2-0 CT1 27 (SUTURE) ×2
SUT VIC AB 2-0 CT1 TAPERPNT 27 (SUTURE) ×1 IMPLANT
SUT VIC AB 4-0 KS 27 (SUTURE) ×3 IMPLANT
SYR BULB IRRIGATION 50ML (SYRINGE) ×3 IMPLANT
TOWEL OR 17X24 6PK STRL BLUE (TOWEL DISPOSABLE) ×3 IMPLANT
TRAY FOLEY BAG SILVER LF 14FR (SET/KITS/TRAYS/PACK) ×3 IMPLANT

## 2017-06-10 SURGICAL SUPPLY — 18 items
ADAPTER VACURETTE TBG SET 14 (CANNULA) ×3 IMPLANT
BALLN POSTPARTUM SOS BAKRI (BALLOONS) ×3
BALLOON POSTPARTUM SOS BAKRI (BALLOONS) ×1 IMPLANT
CATH ROBINSON RED A/P 16FR (CATHETERS) ×3 IMPLANT
CLOTH BEACON ORANGE TIMEOUT ST (SAFETY) ×3 IMPLANT
CONTAINER PREFILL 10% NBF 60ML (FORM) IMPLANT
GLOVE BIO SURGEON STRL SZ 6.5 (GLOVE) ×2 IMPLANT
GLOVE BIO SURGEONS STRL SZ 6.5 (GLOVE) ×1
GLOVE BIOGEL PI IND STRL 7.0 (GLOVE) ×1 IMPLANT
GLOVE BIOGEL PI INDICATOR 7.0 (GLOVE) ×2
GOWN STRL REUS W/TWL LRG LVL3 (GOWN DISPOSABLE) ×6 IMPLANT
PACK VAGINAL MINOR WOMEN LF (CUSTOM PROCEDURE TRAY) ×3 IMPLANT
PAD OB MATERNITY 4.3X12.25 (PERSONAL CARE ITEMS) ×3 IMPLANT
PAD PREP 24X48 CUFFED NSTRL (MISCELLANEOUS) ×3 IMPLANT
SET BERKELEY SUCTION TUBING (SUCTIONS) ×3 IMPLANT
TOWEL OR 17X24 6PK STRL BLUE (TOWEL DISPOSABLE) ×9 IMPLANT
VACURETTE 14MM CVD 1/2 BASE (CANNULA) ×6 IMPLANT
VACURETTE 16MM ASPIR CVD .5 (CANNULA) IMPLANT

## 2017-06-10 NOTE — Progress Notes (Addendum)
Patient ID: Laurie Warren, female   DOB: 09-May-1986, 31 y.o.   MRN: [id number]   Pt with PPH, has been treated with cytotec and methergine and multiple clots expressed  Pt consented for suction D&C, poss laparotomy patchwork stitches, poss hyst.  D/w pt r/b/a of all.  Will proceed emergently  Late entry - prior to D&C, cervix dilated to 4, unable to express all clot in uterus, able to palpate, unable to pull out with [place] currette

## 2017-06-10 NOTE — Anesthesia Preprocedure Evaluation (Signed)
Anesthesia Evaluation  Patient identified by MRN, date of birth, ID band Patient awake    Reviewed: Allergy & Precautions, H&P , NPO status , Patient's Chart, lab work & pertinent test results  History of Anesthesia Complications (+) PONVNegative for: history of anesthetic complications  Airway Mallampati: II  TM Distance: >3 FB Neck ROM: full    Dental no notable dental hx.    Pulmonary neg pulmonary ROS, former smoker,    Pulmonary exam normal breath sounds clear to auscultation       Cardiovascular negative cardio ROS Normal cardiovascular exam Rhythm:regular Rate:Normal     Neuro/Psych negative neurological ROS     GI/Hepatic negative GI ROS, Neg liver ROS,   Endo/Other  negative endocrine ROSdiabetes, Gestational, Oral Hypoglycemic Agents  Renal/GU negative Renal ROS     Musculoskeletal   Abdominal   Peds  Hematology negative hematology ROS (+)   Anesthesia Other Findings PPH  Reproductive/Obstetrics negative OB ROS                             Anesthesia Physical  Anesthesia Plan  ASA: II and emergent  Anesthesia Plan: Spinal and General   Post-op Pain Management:    Induction: Intravenous, Rapid sequence and Cricoid pressure planned  PONV Risk Score and Plan: 4 or greater and Treatment may vary due to age or medical condition, Ondansetron, Dexamethasone and Midazolam  Airway Management Planned: Oral ETT  Additional Equipment:   Intra-op Plan:   Post-operative Plan: Extubation in OR  Informed Consent: I have reviewed the patients History and Physical, chart, labs and discussed the procedure including the risks, benefits and alternatives for the proposed anesthesia with the patient or authorized representative who has indicated his/her understanding and acceptance.   Dental Advisory Given and Dental advisory given  Plan Discussed with: Anesthesiologist, CRNA and  Surgeon  Anesthesia Plan Comments:         Anesthesia Quick Evaluation

## 2017-06-10 NOTE — Anesthesia Postprocedure Evaluation (Signed)
Anesthesia Post Note  Patient: [name]  Procedure(s) Performed: DILATATION AND CURETTAGE (N/A )     Patient location during evaluation: PACU Anesthesia Type: General Level of consciousness: awake and alert Pain management: pain level controlled Vital Signs Assessment: post-procedure vital signs reviewed and stable Respiratory status: spontaneous breathing, nonlabored ventilation and respiratory function stable Cardiovascular status: blood pressure returned to baseline and stable Postop Assessment: no apparent nausea or vomiting Anesthetic complications: no    Last Vitals:  Vitals:   06/10/17 1525 06/10/17 1530  BP:  96/60  Pulse: (!) 114   Resp: 14   Temp:    SpO2: 99%     Last Pain:  Vitals:   06/10/17 1515  TempSrc:   PainSc: Asleep   Pain Goal:                 Lynda Rainwater

## 2017-06-10 NOTE — Interval H&P Note (Signed)
History and Physical Interval Note:  06/10/2017 6:56 AM  Laurie Warren  has presented today for surgery, with the diagnosis of repeat c-section  The various methods of treatment have been discussed with the patient and family. After consideration of risks, benefits and other options for treatment, the patient has consented to  Procedure(s) with comments: REPEAT CESAREAN SECTION (N/A) - [place] as a surgical intervention .  The patient's history has been reviewed, patient examined, no change in status, stable for surgery.  I have reviewed the patient's chart and labs.  Questions were answered to the patient's satisfaction.     Laurie Warren

## 2017-06-10 NOTE — Brief Op Note (Signed)
06/10/2017  2:18 PM  PATIENT:  [name]  31 y.o. female  PRE-OPERATIVE DIAGNOSIS:  D&C--PPH and evacuation of large clots  POST-OPERATIVE DIAGNOSIS:  D&C--PPH and evacuation of large clots  PROCEDURE:  Procedure(s): DILATATION AND CURETTAGE (N/A), Bakhari balloon placement  SURGEON:  Surgeon(s) and Role:    * Bovard-Stuckert, Gary Gabrielsen, MD - Primary  ANESTHESIA:   general  EBL:  1500 mL   BLOOD ADMINISTERED:500 CC PRBC  DRAINS: Urinary Catheter (Foley) and Bakhrari balloon with 480cc of fluid   LOCAL MEDICATIONS USED:  NONE  SPECIMEN:  No Specimen  DISPOSITION OF SPECIMEN:  N/A  COUNTS:  YES  TOURNIQUET:  * No tourniquets in log *  DICTATION: .Other Dictation: Dictation Number [id number]  PLAN OF CARE: return to inpatient  PATIENT DISPOSITION:  PACU - hemodynamically stable.   Delay start of Pharmacological VTE agent (>24hrs) due to surgical blood loss or risk of bleeding: not applicable

## 2017-06-10 NOTE — Anesthesia Preprocedure Evaluation (Addendum)
Anesthesia Evaluation  Patient identified by MRN, date of birth, ID band Patient awake    Reviewed: Allergy & Precautions, H&P , NPO status , Patient's Chart, lab work & pertinent test results  History of Anesthesia Complications (+) PONVNegative for: history of anesthetic complications  Airway Mallampati: II  TM Distance: >3 FB Neck ROM: full    Dental no notable dental hx.    Pulmonary neg pulmonary ROS, former smoker,    Pulmonary exam normal breath sounds clear to auscultation       Cardiovascular negative cardio ROS Normal cardiovascular exam Rhythm:regular Rate:Normal     Neuro/Psych negative neurological ROS     GI/Hepatic negative GI ROS, Neg liver ROS,   Endo/Other  negative endocrine ROSdiabetes  Renal/GU negative Renal ROS     Musculoskeletal   Abdominal   Peds  Hematology negative hematology ROS (+)   Anesthesia Other Findings   Reproductive/Obstetrics negative OB ROS (+) Pregnancy                            Anesthesia Physical  Anesthesia Plan  ASA: II  Anesthesia Plan: Spinal   Post-op Pain Management:    Induction:   PONV Risk Score and Plan: 3 and Treatment may vary due to age or medical condition and Ondansetron  Airway Management Planned: Natural Airway  Additional Equipment:   Intra-op Plan:   Post-operative Plan:   Informed Consent: I have reviewed the patients History and Physical, chart, labs and discussed the procedure including the risks, benefits and alternatives for the proposed anesthesia with the patient or authorized representative who has indicated his/her understanding and acceptance.   Dental Advisory Given  Plan Discussed with: Anesthesiologist, CRNA and Surgeon  Anesthesia Plan Comments:        Anesthesia Quick Evaluation

## 2017-06-10 NOTE — Anesthesia Procedure Notes (Signed)
Procedure Name: Intubation Date/Time: 06/10/2017 1:19 PM Performed by: Garner Nash, CRNA Pre-anesthesia Checklist: Patient identified, Emergency Drugs available, Suction available, Patient being monitored and Timeout performed Patient Re-evaluated:Patient Re-evaluated prior to induction Oxygen Delivery Method: Circle system utilized Preoxygenation: Pre-oxygenation with 100% oxygen Induction Type: IV induction, Rapid sequence and Cricoid Pressure applied Laryngoscope Size: Mac and 3 Grade View: Grade I Tube type: Oral Tube size: 7.0 mm Number of attempts: 1 Airway Equipment and Method: Stylet Placement Confirmation: ETT inserted through vocal cords under direct vision,  positive ETCO2 and breath sounds checked- equal and bilateral Secured at: 22 cm Tube secured with: Tape Dental Injury: Teeth and Oropharynx as per pre-operative assessment

## 2017-06-10 NOTE — Transfer of Care (Signed)
Immediate Anesthesia Transfer of Care Note  Patient: Laurie Warren  Procedure(s) Performed: DILATATION AND CURETTAGE (N/A )  Patient Location: PACU  Anesthesia Type:General  Level of Consciousness: awake and alert   Airway & Oxygen Therapy: Patient Spontanous Breathing and Patient connected to nasal cannula oxygen  Post-op Assessment: Report given to RN and Post -op Vital signs reviewed and stable  Post vital signs: Reviewed  Last Vitals:  Vitals:   06/10/17 1255 06/10/17 1303  BP: 102/66 (!) 100/54  Pulse: 96 83  Resp:    Temp:    SpO2: 100% 100%    Last Pain:  Vitals:   06/10/17 1105  TempSrc: Oral         Complications: No apparent anesthesia complications

## 2017-06-10 NOTE — Anesthesia Procedure Notes (Signed)
Spinal  Patient location during procedure: OB Start time: 06/10/2017 7:24 AM End time: 06/10/2017 7:29 AM Staffing Anesthesiologist: Lynda Rainwater, MD Performed: anesthesiologist  Preanesthetic Checklist Completed: patient identified, surgical consent, pre-op evaluation, timeout performed, IV checked, risks and benefits discussed and monitors and equipment checked Spinal Block Patient position: sitting Prep: site prepped and draped and DuraPrep Patient monitoring: heart rate, cardiac monitor, continuous pulse ox and blood pressure Approach: midline Location: L3-4 Injection technique: single-shot Needle Needle type: Pencan  Needle gauge: 24 G Needle length: 10 cm Assessment Sensory level: T4 Additional Notes Spinal performed by Nurse Anesthesia student under direct supervision

## 2017-06-10 NOTE — Progress Notes (Signed)
At 1200 family called RN into room, just before patient's one hour check to [facility], patient was feeling increased bleeding. With fundal massage patient's fundus was U+3, numerous clots were expressed and large amount of bleeding. RN called for assistance. Dr. Melba Coon was notified at 1210. Postpartum hemorrhage was called. Continued to monitor patient. Dr. Melba Coon notified again at 1247 and came to room at 1255.

## 2017-06-10 NOTE — Transfer of Care (Signed)
Immediate Anesthesia Transfer of Care Note  Patient: Laurie Warren  Procedure(s) Performed: REPEAT CESAREAN SECTION (N/A Abdomen)  Patient Location: PACU  Anesthesia Type:Spinal  Level of Consciousness: awake, alert  and oriented  Airway & Oxygen Therapy: Patient Spontanous Breathing and Patient connected to nasal cannula oxygen  Post-op Assessment: Report given to RN and Post -op Vital signs reviewed and stable  Post vital signs: Reviewed and stable  Last Vitals:  Vitals:   06/10/17 0551 06/10/17 0555  BP: 113/65 113/65  Pulse: 90 90  Resp: 18   Temp: 36.5 C     Last Pain:  Vitals:   06/10/17 0551  TempSrc: Oral         Complications: No apparent anesthesia complications

## 2017-06-10 NOTE — Anesthesia Postprocedure Evaluation (Signed)
Anesthesia Post Note  Patient: [name]  Procedure(s) Performed: REPEAT CESAREAN SECTION (N/A Abdomen)     Patient location during evaluation: PACU Anesthesia Type: Spinal Level of consciousness: oriented and awake and alert Pain management: pain level controlled Vital Signs Assessment: post-procedure vital signs reviewed and stable Respiratory status: spontaneous breathing and respiratory function stable Cardiovascular status: blood pressure returned to baseline and stable Postop Assessment: no headache, no backache and no apparent nausea or vomiting Anesthetic complications: no    Last Vitals:  Vitals:   06/10/17 1026 06/10/17 1027  BP:    Pulse:    Resp: 17 17  Temp:    SpO2: 100% 100%    Last Pain:  Vitals:   06/10/17 0947  TempSrc: Oral   Pain Goal:                 Laurie Warren

## 2017-06-10 NOTE — Lactation Note (Signed)
This note was copied from a baby's chart. Lactation Consultation Note  Patient Name: Laurie Warren Today's Date: 06/10/2017   Infant is 39 hours old & Lactation tried to see mom but she is down in PACU so Lactation will try to see mom after she is in her room on 3rd floor.  Maternal Data    Feeding Feeding Type: Breast Fed  LATCH Score   Interventions    Lactation Tools Discussed/Used     Consult Status      Yvonna Alanis 06/10/2017, 5:17 PM

## 2017-06-10 NOTE — Brief Op Note (Signed)
06/10/2017  8:51 AM  PATIENT:  Laurie Warren  31 y.o. female  PRE-OPERATIVE DIAGNOSIS:  repeat cesarean section  POST-OPERATIVE DIAGNOSIS:  repeat cesarean section  PROCEDURE:  Procedure(s) with comments: REPEAT CESAREAN SECTION (N/A) - Heather K RNFA  SURGEON:  Surgeon(s) and Role:    * Bovard-Stuckert, Jazzmyne Rasnick, MD - Primary  ASSISTANTS: Claretta Fraise RNFA   FINDINGS: viable female infant at 7:55am apgars 9/9, wt P, nl PP uterus, tubes and ovaries/  ANESTHESIA:   spinal  EBL:  1314 mL   BLOOD ADMINISTERED:none  DRAINS: Urinary Catheter (Foley)   LOCAL MEDICATIONS USED:  NONE  SPECIMEN:  Source of Specimen:  Placenta  DISPOSITION OF SPECIMEN:  L&D  COUNTS:  YES  TOURNIQUET:  * No tourniquets in log *  DICTATION: .Other Dictation: Dictation Number [id number]  PLAN OF CARE: Admit to inpatient   PATIENT DISPOSITION:  PACU - hemodynamically stable.   Delay start of Pharmacological VTE agent (>24hrs) due to surgical blood loss or risk of bleeding: not applicable

## 2017-06-11 LAB — BPAM FFP
BLOOD PRODUCT EXPIRATION DATE: 201812191530
ISSUE DATE / TIME: 201812141627
UNIT TYPE AND RH: 6200

## 2017-06-11 LAB — CBC
HCT: 23 % — ABNORMAL LOW (ref 36.0–46.0)
HEMOGLOBIN: 7.5 g/dL — AB (ref 12.0–15.0)
MCH: 30.2 pg (ref 26.0–34.0)
MCHC: 32.6 g/dL (ref 30.0–36.0)
MCV: 92.7 fL (ref 78.0–100.0)
Platelets: 157 10*3/uL (ref 150–400)
RBC: 2.48 MIL/uL — ABNORMAL LOW (ref 3.87–5.11)
RDW: 14.5 % (ref 11.5–15.5)
WBC: 16.9 10*3/uL — ABNORMAL HIGH (ref 4.0–10.5)

## 2017-06-11 LAB — PREPARE FRESH FROZEN PLASMA: UNIT DIVISION: 0

## 2017-06-11 LAB — BIRTH TISSUE RECOVERY COLLECTION (PLACENTA DONATION)

## 2017-06-11 MED ORDER — HYDROMORPHONE HCL 1 MG/ML IJ SOLN
1.0000 mg | Freq: Once | INTRAMUSCULAR | Status: AC
Start: 2017-06-11 — End: 2017-06-11
  Administered 2017-06-11: 1 mg via INTRAVENOUS
  Filled 2017-06-11: qty 1

## 2017-06-11 NOTE — Progress Notes (Signed)
100 cc removed from Bakri balloon.

## 2017-06-11 NOTE — Anesthesia Postprocedure Evaluation (Signed)
Anesthesia Post Note  Patient: [name]  Procedure(s) Performed: REPEAT CESAREAN SECTION (N/A Abdomen)     Patient location during evaluation: [facility] Anesthesia Type: Spinal Level of consciousness: awake and alert Pain management: pain level controlled Vital Signs Assessment: post-procedure vital signs reviewed and stable Respiratory status: spontaneous breathing Cardiovascular status: stable Postop Assessment: no headache, patient able to bend at knees, no apparent nausea or vomiting and spinal receding Anesthetic complications: no Comments: Pain score 5.  Pt states pain in manageable.    Last Vitals:  Vitals:   06/11/17 0546 06/11/17 0820  BP: (!) 94/51 (!) 88/50  Pulse: 90 81  Resp: 18 18  Temp:  37.2 C  SpO2: 98% 100%    Last Pain:  Vitals:   06/11/17 0820  TempSrc: Oral  PainSc:    Pain Goal: Patients Stated Pain Goal: 3 (06/10/17 1930)               Rico Sheehan

## 2017-06-11 NOTE — Progress Notes (Signed)
Patient ID: Laurie Warren, female   DOB: Oct 26, 1985, 31 y.o.   MRN: [id number]   Bakri balloon with minimal output.  Through day have been removing fluid.  No increased bleeding.  Fluid removed entirely at 12:45.  Bakri removed at 2pm.  apx 10-20cc of blood with removal.  Uterus firm 1cm below and firm.    Foley catheter also removed.  Encouraged ambulation.  Bladder scan and I&O cath if no uop in 6 hrs.

## 2017-06-11 NOTE — Addendum Note (Signed)
Addendum  created 06/11/17 1221 by Garner Nash, CRNA   Sign clinical note

## 2017-06-11 NOTE — Anesthesia Postprocedure Evaluation (Signed)
Anesthesia Post Note  Patient: [name]  Procedure(s) Performed: DILATATION AND CURETTAGE (N/A )     Patient location during evaluation: [facility] Unit Anesthesia Type: General Level of consciousness: awake and alert Pain management: pain level controlled Vital Signs Assessment: post-procedure vital signs reviewed and stable Respiratory status: spontaneous breathing Cardiovascular status: stable Postop Assessment: patient able to bend at knees, no apparent nausea or vomiting and no headache Anesthetic complications: no Comments: Pain score 5.  Pt states pain is manageable.    Last Vitals:  Vitals:   06/11/17 0546 06/11/17 0820  BP: (!) 94/51 (!) 88/50  Pulse: 90 81  Resp: 18 18  Temp:  37.2 C  SpO2: 98% 100%    Last Pain:  Vitals:   06/11/17 0820  TempSrc: Oral  PainSc:    Pain Goal: Patients Stated Pain Goal: 3 (06/10/17 1930)               Rico Sheehan

## 2017-06-11 NOTE — Addendum Note (Signed)
Addendum  created 06/11/17 1223 by Garner Nash, CRNA   Sign clinical note

## 2017-06-11 NOTE — Op Note (Signed)
Laurie Warren, Laurie Warren                 ACCOUNT NO.:  [id number]  MEDICAL RECORD NO.:  [id number]  LOCATION:  [facility]                          FACILITY:  [facility]  PHYSICIAN:  Thornell Sartorius, MD        DATE OF BIRTH:  Nov 22, 1985  DATE OF PROCEDURE:  06/10/2017 DATE OF DISCHARGE:                              OPERATIVE REPORT   PREOPERATIVE DIAGNOSES:  Intrauterine pregnancy at 92 plus weeks with history of low-transverse cesarean section, declines vaginal birth after cesarean for repeat low-transverse cesarean section.  POSTOPERATIVE DIAGNOSES:  Intrauterine pregnancy at 39 plus weeks with history of low-transverse cesarean section, declines vaginal birth after cesarean for repeat low-transverse cesarean section, delivered.  PROCEDURE:  Repeat low transverse cesarean section.  SURGEON:  Thornell Sartorius, MD.  ASSISTANT:  Horald Chestnut, RNFA.  FINDINGS:  Viable female infant at 7:55 a.m. with Apgars of 9 at one minute, 9 at five minutes, and weight pending at the time of dictation. Normal postpartum tubes, uterus, and ovaries.  COMPLICATIONS:  None.  PATHOLOGY:  Placenta to L and D.  ANESTHESIA:  Spinal.  IV FLUIDS AND URINE OUTPUT:  Per Anesthesia note.  ESTIMATED BLOOD LOSS:  1314 mL.  DESCRIPTION OF PROCEDURE:  After informed consent was reviewed with the patient including risks, benefits, and alternatives of the surgical procedure, she was transported to the OR, where spinal anesthesia was placed and found to be adequate.  She was then placed in the supine position with a leftward tilt, prepped and draped in the normal sterile fashion.  A Foley catheter was sterilely placed.  A Pfannenstiel skin incision was made at the level approximately 2 fingerbreadths above the pubic symphysis, carried through the underlying layer of fascia sharply. The fascia was incised in the midline.  The incision was extended laterally with Mayo scissors.  The superior aspect of the fascial incision was  grasped with Kocher clamps, elevated, and the rectus muscles were dissected off both bluntly and sharply.  Midline was easily identified and entered bluntly.  The incision was extended superiorly and inferiorly with good visualization of the bladder.  The Alexis skin retractor was placed, carefully making sure that no bowel was entrapped. The uterus was explored.  A transverse uterine incision was made in the lower uterine segment, extended, and the infant was delivered atraumatically from a vertex presentation.  Nose and mouth were suctioned on the field.  After a minute, the cord was clamped and cut and the baby was handed off to the awaiting pediatric staff.  The placenta was expressed.  The uterus was cleared of all clot and debris. The uterine incision was closed in 2 layers of 0 Vicryl, the first of which is running locked and the second as an imbricating suture. Hemostasis was assured.  The gutters were cleared of all clot and debris.  The peritoneum was reapproximated with 2-0 Vicryl in a running fashion.  The subfascial planes were inspected, found to be hemostatic. Fascia was reapproximated from either end, overlapping in the midline with 0 Vicryl.  The subcuticular adipose layer was made hemostatic with Bovie cautery and undermined to allow easier closure of the incision. The dead space was  closed with 3-0 plain gut.  The skin was closed with 4-0 Vicryl in a subcuticular fashion.  Benzoin and Steri-Strips were applied.  The patient tolerated the procedure well.  Sponge, lap, and needle counts were correct x2 per the operating staff.     Thornell Sartorius, MD     JB/MEDQ  D:  06/10/2017  T:  06/11/2017  Job:  [id number]

## 2017-06-11 NOTE — Op Note (Signed)
NAMEREGHAN, THUL NO.:  [id number]  MEDICAL RECORD NO.:  [id number]  LOCATION:  [facility]                          FACILITY:  [facility]  PHYSICIAN:  Thornell Sartorius, MD        DATE OF BIRTH:  1985/08/09  DATE OF PROCEDURE:  06/10/2017 DATE OF DISCHARGE:                              OPERATIVE REPORT   PREOPERATIVE DIAGNOSIS:  Status post repeat low-transverse cesarean section with postpartum hemorrhage and large clots and inability to evacuate clots from her uterus.  POSTOPERATIVE DIAGNOSES:  Status post repeat low-transverse cesarean section with postpartum hemorrhage and large clots and inability to evacuate clots from her uterus, status post suction dilatation and curettage and Bakri balloon placement.  PROCEDURE:  Suction D and C, Bakri balloon placement.  SURGEON:  Thornell Sartorius, MD  ANESTHESIA:  General.  IV FLUIDS AND URINE OUTPUT:  Per the Anesthesia note.  ESTIMATED BLOOD LOSS:  Approximately 1500 mL.  BLOOD THAT WAS ADMINISTRATED:  Approximately 2 units packed red blood cells.  COMPLICATIONS:  None.  PATHOLOGY:  None.  DESCRIPTION OF PROCEDURE:  After informed consent was reviewed with the patient and her partner, including, but not limited to, the risks, benefits, and alternatives of the surgical procedure with possibility of laparotomy and uterine compression or need for hysterectomy as well as D and C and Bakri placement.  She was transported to the operating room, placed on the table in supine position.  General anesthesia was then induced and found to be adequate.  She was then placed in the [place] stirrups, prepped and draped in the normal sterile fashion.  A Foley catheter had previously been placed.  With the placement of an open- sided speculum, a large clot was evacuated and then clot was removed from her uterus both manually and with a 16-French and 12-French suction curette.  Following removal, a manual exploration of the uterus  revealed no further clot.  The Bakri balloon was placed and was filled with 480 mL of fluid in a stepwise fashion.  At 360cc a pause was performed and blood was noted to be leaking around the balloon, therefore, 120 mL again was added.  The instruments were removed from her vagina and her uterus. The bleeding was stable.  She was then cleaned and awakened in stable condition.  Sponge, lap, and needle counts were correct x2 per the operating staff.     Thornell Sartorius, MD     JB/MEDQ  D:  06/10/2017  T:  06/11/2017  Job:  [id number]

## 2017-06-11 NOTE — Lactation Note (Signed)
This note was copied from a baby's chart. Lactation Consultation Note  Patient Name: Laurie Warren PHXTA'V Date: 06/11/2017 Reason for consult: Initial assessment Baby at 70 hr of life. Upon entry baby was crying and mom was trying to latch. Mom declined hands on latch help. She was accepting to verbal coaching. Mom did not bf her 1st child because "he would not latch". She is reporting bilateral nipple soreness, no skin break down or bruising noted. Mom stated she can manually express. Given coconut oil. Discussed baby behavior, feeding frequency, baby belly size, voids, wt loss, breast changes, and nipple care. Given lactation handouts. Aware of OP services and support group.     Maternal Data Has patient been taught Hand Expression?: Yes Does the patient have breastfeeding experience prior to this delivery?: No  Feeding Feeding Type: Breast Fed Length of feed: (still latched at exit)  [facility] Score Latch: Repeated attempts needed to sustain latch, nipple held in mouth throughout feeding, stimulation needed to elicit sucking reflex.  Audible Swallowing: A few with stimulation  Type of Nipple: Everted at rest and after stimulation  Comfort (Breast/Nipple): Filling, red/small blisters or bruises, mild/mod discomfort  Hold (Positioning): No assistance needed to correctly position infant at breast.  LATCH Score: 7  Interventions Interventions: Breast feeding basics reviewed;Hand express;Adjust position;Support pillows;Position options  Lactation Tools Discussed/Used     Consult Status Consult Status: Follow-up Date: 06/12/17 Follow-up type: In-patient    Denzil Hughes 06/11/2017, 1:29 PM

## 2017-06-11 NOTE — Progress Notes (Signed)
Subjective: Postpartum Day 1: Cesarean Delivery Patient reports incisional pain and tolerating PO.  Feeling better  Objective: Vital signs in last 24 hours: Temp:  [97.3 F (36.3 C)-99.1 F (37.3 C)] 98.5 F (36.9 C) (12/15 0417) Pulse Rate:  [71-119] 90 (12/15 0546) Resp:  [9-32] 18 (12/15 0546) BP: (77-111)/(42-75) 94/51 (12/15 0546) SpO2:  [96 %-100 %] 98 % (12/15 0546) Weight:  [78.5 kg (173 lb)] 78.5 kg (173 lb) (12/14 1830) S/p transfusion 3 u PRBCs, FFP  Physical Exam:  General: alert and no distress Lochia: appropriate Uterine Fundus: firm below umb; bakri in place - letting fluid out 100cc q 1 hr Incision: healing well DVT Evaluation: No evidence of DVT seen on physical exam.  Recent Labs    06/10/17 1749 06/11/17 0519  HGB 9.8* 7.5*  HCT 29.2* 23.0*    Assessment/Plan: Status post Cesarean section. Doing well postoperatively.  Continue current care - check CBC tomorrow Work on Breastfeeding Routine PP care. Will remove balloon - 1 hr after fluid all rmoved.    Laurie Warren 06/11/2017, 8:30 AM

## 2017-06-11 NOTE — Progress Notes (Signed)
   06/11/17 0055  Vital Signs  BP (!) 89/45  Pulse Rate 84  Resp 18  Temp (!) 97.3 F (36.3 C)  Temp Source Axillary  POSS Scale (Pasero Opioid Sedation Scale)  POSS *See Group Information* 2-Acceptable,Slightly drowsy, easily aroused  PCA/Epidural/Spinal Assessment  Respiratory Pattern Regular;Unlabored;Symmetrical;No dyspnea  Oxygen Therapy  SpO2 97 %  O2 Device Room Air  Pt;s BP as above, asymptomatic. Dr. Melba Coon called, no answer, let message, waiting for response

## 2017-06-12 LAB — CBC
HEMATOCRIT: 21.4 % — AB (ref 36.0–46.0)
Hemoglobin: 6.9 g/dL — CL (ref 12.0–15.0)
MCH: 30.8 pg (ref 26.0–34.0)
MCHC: 32.2 g/dL (ref 30.0–36.0)
MCV: 95.5 fL (ref 78.0–100.0)
PLATELETS: 173 10*3/uL (ref 150–400)
RBC: 2.24 MIL/uL — ABNORMAL LOW (ref 3.87–5.11)
RDW: 14.9 % (ref 11.5–15.5)
WBC: 15.5 10*3/uL — AB (ref 4.0–10.5)

## 2017-06-12 LAB — PREPARE RBC (CROSSMATCH)

## 2017-06-12 MED ORDER — DIPHENHYDRAMINE HCL 25 MG PO CAPS
25.0000 mg | ORAL_CAPSULE | Freq: Once | ORAL | Status: AC
  Administered 2017-06-12: 25 mg via ORAL
  Filled 2017-06-12: qty 1

## 2017-06-12 MED ORDER — ACETAMINOPHEN 325 MG PO TABS
650.0000 mg | ORAL_TABLET | Freq: Once | ORAL | Status: AC
  Administered 2017-06-12: 650 mg via ORAL
  Filled 2017-06-12: qty 2

## 2017-06-12 MED ORDER — SODIUM CHLORIDE 0.9 % IV SOLN
Freq: Once | INTRAVENOUS | Status: AC
  Administered 2017-06-12: 17:00:00 via INTRAVENOUS

## 2017-06-12 NOTE — Progress Notes (Signed)
Subjective: Postpartum Day 2: Cesarean Delivery/D&C Bakri balloon for [facility] Patient reports incisional pain, tolerating PO and no problems voiding.  Some cramping  Nl lochia, pain controlled.  Objective: Vital signs in last 24 hours: Temp:  [98.7 F (37.1 C)-99.1 F (37.3 C)] 99.1 F (37.3 C) (12/16 0420) Pulse Rate:  [81-117] 117 (12/16 0420) Resp:  [17-18] 17 (12/16 0420) BP: (88-107)/(50-58) 104/58 (12/16 0420) SpO2:  [98 %-100 %] 98 % (12/16 0420)  Physical Exam:  General: alert, cooperative and no distress Lochia: appropriate Uterine Fundus: firm Incision: healing well DVT Evaluation: No evidence of DVT seen on physical exam.  Recent Labs    06/11/17 0519 06/12/17 0526  HGB 7.5* 6.9*  HCT 23.0* 21.4*    Assessment/Plan: Status post Cesarean section. Doing well postoperatively.  Hgb decreased to 6.9 - feels OK, likely equilibrating.   Pt not making colostrum/milk not in - d/w pt blood transfusion x 1 unit to boost Hgb/?milk.  D/w pt r/b/a of transfusion.    Continue current care.  Laurie Warren 06/12/2017, 7:59 AM

## 2017-06-12 NOTE — Progress Notes (Signed)
Spoke with Dr. Melba Coon about lab order for type and screen. Per lab, needs to be drawn by midnight. Dr. Melba Coon said to cancel type and screen.

## 2017-06-12 NOTE — Lactation Note (Signed)
This note was copied from a baby's chart. Lactation Consultation Note  Patient Name: Laurie Warren DHRCB'U Date: 06/12/2017 Reason for consult: Follow-up assessment;Infant weight loss(10 % weight loss, BR/F , mom has a low Hgb, see LC note )  Baby is 63 hours old,  LC reviewed doc flow sheets and updated per mom  Family at bedside and Young asked permission to talk in front of family and mom consented.  Per mom baby recently was bottle fed 30 ml at 1330/ tolerated well, presently sleeping.  LC was asked by Dr. Melba Coon to speak to mom regarding the effects of PPH, low Hgb of 6.9 today  with milk supply.  LC discussed the effects of potential delay with milk coming in and level of milk supply volume.  LC mentioned with an increase Hgb with a transfusion it can be a boost for milk supply.  Per mom I'm ok with getting the blood transfusion today if it will help.  LC recommended to continue breast feeding and supplementing EBM or formula due to 10 % weight loss.  LC communicated to the [place] mom would like her transfusion.     Maternal Data Has patient been taught Hand Expression?: (enc with pumping )  Feeding Feeding Type: (baby recently received 30 ml of formula )  LATCH Score                   Interventions Interventions: Breast feeding basics reviewed;DEBP  Lactation Tools Discussed/Used Tools: Pump Breast pump type: Double-Electric Breast Pump   Consult Status Consult Status: Follow-up Date: 06/13/17 Follow-up type: In-patient    [place] 06/12/2017, 1:47 PM

## 2017-06-13 LAB — CBC
HCT: 23.7 % — ABNORMAL LOW (ref 36.0–46.0)
Hemoglobin: 7.9 g/dL — ABNORMAL LOW (ref 12.0–15.0)
MCH: 31.3 pg (ref 26.0–34.0)
MCHC: 33.3 g/dL (ref 30.0–36.0)
MCV: 94 fL (ref 78.0–100.0)
PLATELETS: 175 10*3/uL (ref 150–400)
RBC: 2.52 MIL/uL — AB (ref 3.87–5.11)
RDW: 15.9 % — AB (ref 11.5–15.5)
WBC: 10.4 10*3/uL (ref 4.0–10.5)

## 2017-06-13 LAB — TYPE AND SCREEN
ABO/RH(D): A POS
ANTIBODY SCREEN: NEGATIVE
UNIT DIVISION: 0
UNIT DIVISION: 0
UNIT DIVISION: 0
Unit division: 0
Unit division: 0

## 2017-06-13 LAB — BPAM RBC
BLOOD PRODUCT EXPIRATION DATE: 201901132359
Blood Product Expiration Date: 201812272359
Blood Product Expiration Date: 201901062359
Blood Product Expiration Date: 201901062359
Blood Product Expiration Date: 201901132359
ISSUE DATE / TIME: 201812141337
ISSUE DATE / TIME: 201812140932
ISSUE DATE / TIME: 201812141337
ISSUE DATE / TIME: 201812161719
UNIT TYPE AND RH: 6200
UNIT TYPE AND RH: 6200
UNIT TYPE AND RH: 6200
Unit Type and Rh: 5100
Unit Type and Rh: 6200

## 2017-06-13 MED ORDER — OXYCODONE-ACETAMINOPHEN 5-325 MG PO TABS: 1.0000 | ORAL_TABLET | ORAL | 0 refills | Status: DC | PRN

## 2017-06-13 MED ORDER — IBUPROFEN 800 MG PO TABS: 800.0000 mg | ORAL_TABLET | Freq: Three times a day (TID) | ORAL | 1 refills | Status: DC | PRN

## 2017-06-13 NOTE — Discharge Instructions (Signed)
Nothing in vagina for 6 weeks.  No sex, tampons, and douching.  Other instructions as in [facility] Discharge Booklet. °

## 2017-06-13 NOTE — Progress Notes (Signed)
Patient ID: Laurie Warren, female   DOB: [birth year], 31 y.o.   MRN: [id number] Pt doing well. Sitting in chair at bedside. Pain well controlled. No fever, chills. Lochia mild. Denies CP, SOB, HA or dizziness. Milk came in last night - bonding well with baby. Feels ready for discharge to home today VSS ABD - soft, FF, dressing c/d/i EXT - +2 edema b/l, non pitting, no homans  10.4>7.9<175  A/P: POD#3 s/p repeat ltc/s with pp hemorrhage and subsequent transfusion now stable     Reviewed post op/postpartum instructions     Discharge to home today     Incision check in two weeks and pp visit in 6weeks

## 2017-06-13 NOTE — Lactation Note (Addendum)
This note was copied from a baby's chart. Lactation Consultation Note  Patient Name: Laurie Warren FIEPP'I Date: 06/13/2017 Reason for consult: Follow-up assessment;Other (Comment);Infant weight loss(PPH , transfused last evening, and milk  is in , see LC note . baby increased weight since yesterday )  Baby is 63 hours old.  LC reviewed and updated the doc flow sheets As LC entered the room , mom and dad getting ready for D/C and baby asleep.  During the [facility] consult baby woke up and rooting.  LC assisted to latch on the left breast / football, multiple swallows noted, increased with breast compressions  And still feeding at 20 mins. Per mom more comfortable to compared with other latches.  LC assessed both nipples and breast with assessment  with moms permission.  Noted positional strips on both, and semi compressible areolas.  LC reviewed steps for latching to prevent soreness from increasing, and worked on depth with latch.  Sore nipple and engorgement prevention and tx reviewed.  LC recommended prior to latch - breast massage, hand express, pre-pump if needed, latch with breast compressions  Until swallows and intermittent. After feeding post pump if needed for 10 mins. After 4 feedings a day and Prn  due to [facility]. Comfort gels after feedings , when warm , rinse with warm water, and then use shells.  Per mom has  DEBP.  Mom receptive to return for [facility] O/P appt. 12/26 or 12/27 , LC placed in clinic basket to call mom , and mom aware she will receive a call.  Mother informed of post-discharge support and given phone number to the lactation department, including services for phone call assistance; out-patient appointments; and breastfeeding support group. List of other breastfeeding resources in the community given in the handout. Encouraged mother to call for problems or concerns related to breastfeeding.  Maternal Data Has patient been taught Hand Expression?: Yes  Feeding Feeding Type:  Breast Fed Length of feed: (multiple swallows, increased with breast compressions / and still feeding at 20 mins )  LATCH Score Latch: Grasps breast easily, tongue down, lips flanged, rhythmical sucking.  Audible Swallowing: Spontaneous and intermittent  Type of Nipple: Everted at rest and after stimulation  Comfort (Breast/Nipple): Filling, red/small blisters or bruises, mild/mod discomfort  Hold (Positioning): Assistance needed to correctly position infant at breast and maintain latch.  LATCH Score: 8  Interventions Interventions: Breast feeding basics reviewed;Assisted with latch;Breast massage;Hand express;Skin to skin;Pre-pump if needed;Reverse pressure;Breast compression;Adjust position;Support pillows;Position options;Shells;Comfort gels;Hand pump;DEBP  Lactation Tools Discussed/Used Tools: Shells;Pump;Comfort gels;Coconut oil Shell Type: Inverted Breast pump type: Double-Electric Breast Pump WIC Program: No Pump Review: Setup, frequency, and cleaning;Milk Storage Initiated by:: [place] reviewed / MAI  Date initiated:: 06/13/17   Consult Status Consult Status: Follow-up Date: ([place] placed a request for [facility] O/P 12/26 or 12/27 ) Follow-up type: Out-patient([facility] O/P clinic )    Jerlyn Ly Pasqualino Witherspoon 06/13/2017, 12:01 PM

## 2017-06-13 NOTE — Discharge Summary (Signed)
OB Discharge Summary     Patient Name: Laurie Warren DOB: 08/07/1985 MRN: [id number]  Date of admission: 06/10/2017 Delivering MD: Janyth Contes   Date of discharge: 06/13/2017  Admitting diagnosis: repeat c-section Intrauterine pregnancy: [redacted]w[redacted]d     Secondary diagnosis:  Principal Problem:   PPH (postpartum hemorrhage) Active Problems:   Normal pregnancy in multigravida in third trimester   S/P cesarean section  Additional problems: none     Discharge diagnosis: Term Pregnancy Delivered and GDM A2                                                                                                Post partum procedures:blood transfusion  Augmentation: n/a  Complications: [id number]  Hospital course:  Sceduled C/S   31 y.o. yo G2P2002 at [redacted]w[redacted]d was admitted to the hospital 06/10/2017 for scheduled cesarean section with the following indication:Elective Repeat.  Membrane Rupture Time/Date: 7:54 AM ,06/10/2017   Patient delivered a Viable infant.06/10/2017  Details of operation can be found in separate operative note.  Pateint had an uncomplicated postpartum course.  She is ambulating, tolerating a regular diet, passing flatus, and urinating well. Patient is discharged home in stable condition on  06/13/17         Physical exam  Vitals:   06/12/17 2042 06/12/17 2345 06/13/17 0408 06/13/17 0905  BP: 107/63 (!) 97/58 110/66 111/70  Pulse: (!) 101 91 (!) 112 89  Resp: 18 18 20 18   Temp: 97.9 F (36.6 C) 98.3 F (36.8 C) 98 F (36.7 C) 98.5 F (36.9 C)  TempSrc: Oral Oral Oral Oral  SpO2: 98% 98% 98% 100%  Weight:      Height:       General: alert, cooperative and no distress Lochia: appropriate Uterine Fundus: firm Incision: Healing well with no significant drainage DVT Evaluation: Negative Homan's sign. Calf/Ankle edema is present Labs: Lab Results  Component Value Date   WBC 10.4 06/13/2017   HGB 7.9 (L) 06/13/2017   HCT 23.7 (L) 06/13/2017   MCV  94.0 06/13/2017   PLT 175 06/13/2017   CMP Latest Ref Rng & Units 02/20/2016  Glucose 65 - 99 mg/dL 115(H)  BUN 6 - 20 mg/dL 11  Creatinine 0.44 - 1.00 mg/dL 0.65  Sodium 135 - 145 mmol/L 137  Potassium 3.5 - 5.1 mmol/L 4.4  Chloride 101 - 111 mmol/L 107  CO2 22 - 32 mmol/L 23  Calcium 8.9 - 10.3 mg/dL 9.2    Discharge instruction: per After Visit Summary and "Baby and Me Booklet".  After visit meds:  Allergies as of 06/13/2017      Reactions   Sulfa Antibiotics Anaphylaxis   Latex Swelling, Other (See Comments)   Redness.       Medication List    TAKE these medications   ibuprofen 800 MG tablet Commonly known as:  ADVIL,MOTRIN Take 1 tablet (800 mg total) by mouth every 8 (eight) hours as needed.   LANTUS 100 UNIT/ML injection Generic drug:  insulin glargine Inject 16 Units into the skin at bedtime.   oxyCODONE-acetaminophen 5-325 MG tablet Commonly known as:  PERCOCET Take 1 tablet by mouth every 4 (four) hours as needed for severe pain.   PRENATAL PO Take 1 tablet by mouth at bedtime.       Diet: routine diet  Activity: Advance as tolerated. Pelvic rest for 6 weeks.   Outpatient follow up:2 weeks Follow up Appt:No future appointments. Follow up Visit:No Follow-up on file.  Postpartum contraception: Not Discussed  Newborn Data: Live born female  Birth Weight: 7 lb 15.2 oz (3605 g) APGAR: 87, 9  Newborn Delivery   Birth date/time:  06/10/2017 07:55:00 Delivery type:  C-Section, Low Transverse C-section categorization:  Repeat     Baby Feeding: Breast Disposition:home with mother   06/13/2017 Isaiah Serge, DO

## 2017-06-13 NOTE — Progress Notes (Signed)
Called Dr. Terri Piedra to clarify Lantus order AVS medication list.  Dr. Terri Piedra said that the patient does not need to take Lantus at this time.  She would like for the patient to take several HS CBGs and bring the results to her 2 week incision check.

## 2017-06-13 NOTE — Progress Notes (Signed)
Pt discharged to home with husband and newborn daughter.  Condition stable.  Reviewed discharge instructions with pt and husband together.  Both expressed feelings of fear regarding PPH, relief that pt is OK now, and gratitude for the care she received both from her nurses and from Dr. Melba Coon.  Pt to car via wheelchair with Gerlean Ren, RN and Frederico Hamman, NT.  No equipment for home ordered at discharge.

## 2017-11-05 ENCOUNTER — Inpatient Hospital Stay (HOSPITAL_COMMUNITY)
Admission: AD | Admit: 2017-11-05 | Discharge: 2017-11-05 | Disposition: A | Discharge status: Home / Self Care | Payer: BLUE CROSS/BLUE SHIELD | Source: Ambulatory Visit | Attending: Obstetrics and Gynecology | Admitting: Obstetrics and Gynecology

## 2017-11-05 ENCOUNTER — Encounter (HOSPITAL_COMMUNITY): Payer: Self-pay

## 2017-11-05 ENCOUNTER — Other Ambulatory Visit: Payer: Self-pay

## 2017-11-05 DIAGNOSIS — Z791 Long term (current) use of non-steroidal anti-inflammatories (NSAID): Secondary | ICD-10-CM | POA: Insufficient documentation

## 2017-11-05 DIAGNOSIS — Z91040 Latex allergy status: Secondary | ICD-10-CM | POA: Insufficient documentation

## 2017-11-05 DIAGNOSIS — N3000 Acute cystitis without hematuria: Secondary | ICD-10-CM | POA: Insufficient documentation

## 2017-11-05 DIAGNOSIS — Z888 Allergy status to other drugs, medicaments and biological substances status: Secondary | ICD-10-CM | POA: Diagnosis not present

## 2017-11-05 DIAGNOSIS — N939 Abnormal uterine and vaginal bleeding, unspecified: Secondary | ICD-10-CM | POA: Insufficient documentation

## 2017-11-05 DIAGNOSIS — Z87891 Personal history of nicotine dependence: Secondary | ICD-10-CM | POA: Diagnosis not present

## 2017-11-05 DIAGNOSIS — Z79899 Other long term (current) drug therapy: Secondary | ICD-10-CM | POA: Insufficient documentation

## 2017-11-05 DIAGNOSIS — Z794 Long term (current) use of insulin: Secondary | ICD-10-CM | POA: Insufficient documentation

## 2017-11-05 DIAGNOSIS — R102 Pelvic and perineal pain: Secondary | ICD-10-CM | POA: Diagnosis not present

## 2017-11-05 DIAGNOSIS — Z9889 Other specified postprocedural states: Secondary | ICD-10-CM | POA: Diagnosis not present

## 2017-11-05 DIAGNOSIS — Z882 Allergy status to sulfonamides status: Secondary | ICD-10-CM | POA: Insufficient documentation

## 2017-11-05 LAB — CBC WITH DIFFERENTIAL/PLATELET
Basophils Absolute: 0 10*3/uL (ref 0.0–0.1)
Basophils Relative: 0 %
EOS ABS: 0.1 10*3/uL (ref 0.0–0.7)
EOS PCT: 1 %
HCT: 43.2 % (ref 36.0–46.0)
Hemoglobin: 14.4 g/dL (ref 12.0–15.0)
LYMPHS ABS: 1.7 10*3/uL (ref 0.7–4.0)
LYMPHS PCT: 10 %
MCH: 29.9 pg (ref 26.0–34.0)
MCHC: 33.3 g/dL (ref 30.0–36.0)
MCV: 89.6 fL (ref 78.0–100.0)
MONOS PCT: 6 %
Monocytes Absolute: 1.1 10*3/uL — ABNORMAL HIGH (ref 0.1–1.0)
Neutro Abs: 14.8 10*3/uL — ABNORMAL HIGH (ref 1.7–7.7)
Neutrophils Relative %: 83 %
PLATELETS: 316 10*3/uL (ref 150–400)
RBC: 4.82 MIL/uL (ref 3.87–5.11)
RDW: 14.1 % (ref 11.5–15.5)
WBC: 17.8 10*3/uL — ABNORMAL HIGH (ref 4.0–10.5)

## 2017-11-05 LAB — URINALYSIS, ROUTINE W REFLEX MICROSCOPIC
BILIRUBIN URINE: NEGATIVE
GLUCOSE, UA: NEGATIVE mg/dL
Ketones, ur: 5 mg/dL — AB
Nitrite: NEGATIVE
PH: 5 (ref 5.0–8.0)
Protein, ur: NEGATIVE mg/dL
SPECIFIC GRAVITY, URINE: 1.02 (ref 1.005–1.030)

## 2017-11-05 LAB — POCT PREGNANCY, URINE: PREG TEST UR: NEGATIVE

## 2017-11-05 MED ORDER — HYDROMORPHONE HCL 1 MG/ML IJ SOLN
0.5000 mg | Freq: Once | INTRAMUSCULAR | Status: AC
  Administered 2017-11-05: 0.5 mg via INTRAVENOUS
  Filled 2017-11-05: qty 1

## 2017-11-05 MED ORDER — ONDANSETRON HCL 4 MG/2ML IJ SOLN
4.0000 mg | Freq: Once | INTRAMUSCULAR | Status: AC
  Administered 2017-11-05: 4 mg via INTRAVENOUS
  Filled 2017-11-05: qty 2

## 2017-11-05 MED ORDER — FLUCONAZOLE 150 MG PO TABS: 150.0000 mg | ORAL_TABLET | Freq: Every day | ORAL | 0 refills | Status: DC

## 2017-11-05 MED ORDER — NITROFURANTOIN MONOHYD MACRO 100 MG PO CAPS
100.0000 mg | ORAL_CAPSULE | Freq: Two times a day (BID) | ORAL | 0 refills | Status: DC
Start: 2017-11-05 — End: 2019-10-26

## 2017-11-05 NOTE — MAU Provider Note (Signed)
History     CSN: [id number]  Arrival date and time: 11/05/17 1104   First Provider Initiated Contact with Patient 11/05/17 1209      Chief Complaint  Patient presents with  . Vaginal Bleeding   HPI Laurie Warren is a 32 y.o. non pregnant female who presents with vaginal bleeding and abdominal pain. Symptoms began at 3am this morning. She is 1 week s/p LEEP by Dr. Melba Coon. Denies any complaints or complications before this morning. Has had 3 episode of heavy bright red bleeding and large clots since this morning. Minimal bleeding between episodes. Since this morning has had pelvic pain that is getting progressively worse. Rates pain 7/10 & describes as cramping. Has not treated symptoms. Denies fever/chills, dysuria, vaginal discharge, or change in activity.   Past Medical History:  Diagnosis Date  . ADD (attention deficit disorder)   . Normal pregnancy in multigravida in third trimester 06/10/2017  . PONV (postoperative nausea and vomiting)   . PPH (postpartum hemorrhage) 06/10/2017  . S/P cesarean section 06/10/2017  . Sepsis ([place]) 2011   hx of  due to untreated bronchitis that was misdiagnosed  . Vaginal Pap smear, abnormal     Past Surgical History:  Procedure Laterality Date  . CESAREAN SECTION     8 years ago  . CESAREAN SECTION N/A 06/10/2017   Procedure: REPEAT CESAREAN SECTION;  Surgeon: Janyth Contes, MD;  Location: [place];  Service: Obstetrics;  Laterality: N/A;  [place] K RNFA  . CHOLECYSTECTOMY    . DILATION AND CURETTAGE OF UTERUS N/A 06/10/2017   Procedure: DILATATION AND CURETTAGE;  Surgeon: Janyth Contes, MD;  Location: [place];  Service: Gynecology;  Laterality: N/A;  . IRRIGATION AND DEBRIDEMENT ABSCESS N/A 02/19/2016   Procedure: IRRIGATION AND DEBRIDEMENT LEFT LABIAL ABSCESS;  Surgeon: Coralie Keens, MD;  Location: [facility];  Service: General;  Laterality: N/A;  . TONSILLECTOMY      No family history on file.  Social  History   Tobacco Use  . Smoking status: Former Smoker    Packs/day: 0.50    Years: 15.00    Pack years: 7.50    Types: Cigarettes    Last attempt to quit: 09/28/2016    Years since quitting: 1.1  . Smokeless tobacco: Never Used  Substance Use Topics  . Alcohol use: Yes    Comment: social drinker  . Drug use: No    Allergies:  Allergies  Allergen Reactions  . Sulfa Antibiotics Anaphylaxis  . Latex Swelling and Other (See Comments)    Redness.   . Lidocaine Palpitations    Medications Prior to Admission  Medication Sig Dispense Refill Last Dose  . ibuprofen (ADVIL,MOTRIN) 800 MG tablet Take 1 tablet (800 mg total) by mouth every 8 (eight) hours as needed. 30 tablet 1   . insulin glargine (LANTUS) 100 UNIT/ML injection Inject 16 Units into the skin at bedtime.   06/09/2017 at Unknown time  . oxyCODONE-acetaminophen (PERCOCET) 5-325 MG tablet Take 1 tablet by mouth every 4 (four) hours as needed for severe pain. 30 tablet 0   . Prenatal Vit-Fe Fumarate-FA (PRENATAL PO) Take 1 tablet by mouth at bedtime.   06/09/2017 at Unknown time    Review of Systems  Constitutional: Negative.   Gastrointestinal: Positive for abdominal pain and nausea. Negative for constipation, diarrhea and vomiting.  Genitourinary: Positive for frequency, pelvic pain and vaginal bleeding. Negative for dysuria.   Physical Exam   Blood pressure (!) 98/52, pulse 85, temperature 98.3  F (36.8 C), temperature source Oral, resp. rate 18, weight 154 lb (69.9 kg), last menstrual period 10/23/2017, SpO2 99 %, unknown if currently breastfeeding.  Orthostatic VS for the past 24 hrs:  BP- Lying Pulse- Lying BP- Sitting Pulse- Sitting BP- Standing at 0 minutes Pulse- Standing at 0 minutes  11/05/17 1201 94/61 93 105/69 101 97/69 119     Physical Exam  Nursing note and vitals reviewed. Constitutional: She is oriented to person, place, and time. She appears well-developed and well-nourished. No distress.  HENT:   Head: Normocephalic and atraumatic.  Eyes: Conjunctivae are normal. Right eye exhibits no discharge. Left eye exhibits no discharge. No scleral icterus.  Neck: Normal range of motion.  Cardiovascular: Normal rate, regular rhythm and normal heart sounds.  No murmur heard. Respiratory: Effort normal and breath sounds normal. No respiratory distress. She has no wheezes.  GI: Soft. Bowel sounds are normal. She exhibits no distension. There is tenderness in the suprapubic area. There is no rigidity, no rebound and no guarding.  Genitourinary:  Genitourinary Comments: Area where LEEP was performed appears to be 3-7 o'clock of cervix, tissue is pale pink, areas of eschar at about 6-7 o'clock. No bleeding from site of LEEP.  Minimal amount of dark red blood coming from os.   Neurological: She is alert and oriented to person, place, and time.  Skin: Skin is warm and dry. She is not diaphoretic.  Psychiatric: She has a normal mood and affect. Her behavior is normal. Judgment and thought content normal.    MAU Course  Procedures Results for orders placed or performed during the hospital encounter of 11/05/17 (from the past 24 hour(s))  Urinalysis, Routine w reflex microscopic     Status: Abnormal   Collection Time: 11/05/17 11:19 AM  Result Value Ref Range   Color, Urine YELLOW YELLOW   APPearance HAZY (A) CLEAR   Specific Gravity, Urine 1.020 1.005 - 1.030   pH 5.0 5.0 - 8.0   Glucose, UA NEGATIVE NEGATIVE mg/dL   Hgb urine dipstick LARGE (A) NEGATIVE   Bilirubin Urine NEGATIVE NEGATIVE   Ketones, ur 5 (A) NEGATIVE mg/dL   Protein, ur NEGATIVE NEGATIVE mg/dL   Nitrite NEGATIVE NEGATIVE   Leukocytes, UA MODERATE (A) NEGATIVE   RBC / HPF 6-10 0 - 5 RBC/hpf   WBC, UA 6-10 0 - 5 WBC/hpf   Bacteria, UA RARE (A) NONE SEEN   Squamous Epithelial / LPF 11-20 0 - 5   Mucus PRESENT   Pregnancy, urine POC     Status: None   Collection Time: 11/05/17 11:45 AM  Result Value Ref Range   Preg Test,  Ur NEGATIVE NEGATIVE  CBC with Differential     Status: Abnormal   Collection Time: 11/05/17 12:07 PM  Result Value Ref Range   WBC 17.8 (H) 4.0 - 10.5 K/uL   RBC 4.82 3.87 - 5.11 MIL/uL   Hemoglobin 14.4 12.0 - 15.0 g/dL   HCT 43.2 36.0 - 46.0 %   MCV 89.6 78.0 - 100.0 fL   MCH 29.9 26.0 - 34.0 pg   MCHC 33.3 30.0 - 36.0 g/dL   RDW 14.1 11.5 - 15.5 %   Platelets 316 150 - 400 K/uL   Neutrophils Relative % 83 %   Neutro Abs 14.8 (H) 1.7 - 7.7 K/uL   Lymphocytes Relative 10 %   Lymphs Abs 1.7 0.7 - 4.0 K/uL   Monocytes Relative 6 %   Monocytes Absolute 1.1 (H) 0.1 - 1.0  K/uL   Eosinophils Relative 1 %   Eosinophils Absolute 0.1 0.0 - 0.7 K/uL   Basophils Relative 0 %   Basophils Absolute 0.0 0.0 - 0.1 K/uL    MDM Pt initially tachycardic, worse with standing during orthostatic vitals. IV fluid bolus given.   Pt denies urinary complaints but since being in MAU has voided 3 times. States since this morning has had urinary frequency and urgency that has worsened since being here. No dysuria. Discussed u/a results and tx of UTI vs waiting for urine cx; pt would like to be treated today.   C/w Dr. Willis Modena. As pt is not bleeding now and site of LEEP is not bleeding, pt ok to be discharged home with instruction to call office on Monday with update.   Assessment and Plan  A: 1. S/P LEEP   2. Vaginal bleeding   3. Acute cystitis without hematuria    P: Discharge home Rx macrobid Urine cx pending Call office Monday with update on symptoms Discussed reasons to return to MAU vs calling on call MD  Jorje Guild 11/05/2017, 12:10 PM

## 2017-11-05 NOTE — MAU Note (Signed)
Patient c/o Woke up around 3am with "heavy bleeding and clots" Golf ball size clots When woke up she states she "was covered in blood" Has gone through 2 pads today  Has persisted and was advised by on call MD to come in.   Lower abdominal pain Cramping in nature Constant Has not taken any medication Rating pain 6/10  1 week post LEEP

## 2017-11-05 NOTE — Discharge Instructions (Signed)
Loop Electrosurgical Excision Procedure, Care After Refer to this sheet in the next few weeks. These instructions provide you with information about caring for yourself after your procedure. Your health care provider may also give you more specific instructions. Your treatment has been planned according to current medical practices, but problems sometimes occur. Call your health care provider if you have any problems or questions after your procedure. What can I expect after the procedure? After the procedure, it is common to have:  Abdominal cramps that are similar to menstrual cramps. These may last for up to 1 week.  Pink-tinged or bloody vaginal discharge, including light to moderate bleeding, for 1-2 weeks.  A dark-colored vaginal discharge. This is from the paste that was applied to your cervix to control bleeding.  Follow these instructions at home: Activity  Return to your normal activities as told by your health care provider. Ask your health care provider what activities are safe for you.  Avoid strenuous physical activity for as long as told by your health care provider.  Do not lift anything that is heavier than 10 lb (4.5 kg) until your health care provider says that it is safe. Bathing  Do not take baths, swim, or use a hot tub until your health care provider approves.  You may take showers. Lifestyle  Do not put anything in your vagina for 2 weeks after the procedure or until your health care provider says that it is okay. This includes tampons, creams, and douches.  Do not have sexual intercourse until your health care provider approves. General instructions  Take over-the-counter and prescription medicines only as told by your health care provider.  Keep all follow-up visits as told by your health care provider. This is important. Contact a health care provider if:  You have a fever or chills.  You feel unusually weak.  You have vaginal bleeding that is  heavier or longer than a normal menstrual cycle. A sign of this can be soaking a pad with blood.  You have severe pain.  You have nausea or vomiting.  You develop a bad smelling vaginal discharge. This information is not intended to replace advice given to you by your health care provider. Make sure you discuss any questions you have with your health care provider. Document Released: 02/25/2011 Document Revised: 07/11/2015 Document Reviewed: 04/28/2015 Elsevier Interactive Patient Education  2018 [employer]. Urinary Tract Infection, Adult A urinary tract infection (UTI) is an infection of any part of the urinary tract, which includes the kidneys, ureters, bladder, and urethra. These organs make, store, and get rid of urine in the body. UTI can be a bladder infection (cystitis) or kidney infection (pyelonephritis). What are the causes? This infection may be caused by fungi, viruses, or bacteria. Bacteria are the most common cause of UTIs. This condition can also be caused by repeated incomplete emptying of the bladder during urination. What increases the risk? This condition is more likely to develop if:  You ignore your need to urinate or hold urine for long periods of time.  You do not empty your bladder completely during urination.  You wipe back to front after urinating or having a bowel movement, if you are female.  You are uncircumcised, if you are female.  You are constipated.  You have a urinary catheter that stays in place (indwelling).  You have a weak defense (immune) system.  You have a medical condition that affects your bowels, kidneys, or bladder.  You have diabetes.  You take antibiotic medicines frequently or for long periods of time, and the antibiotics no longer work well against certain types of infections (antibiotic resistance).  You take medicines that irritate your urinary tract.  You are exposed to chemicals that irritate your urinary tract.  You are  female.  What are the signs or symptoms? Symptoms of this condition include:  Fever.  Frequent urination or passing small amounts of urine frequently.  Needing to urinate urgently.  Pain or burning with urination.  Urine that smells bad or unusual.  Cloudy urine.  Pain in the lower abdomen or back.  Trouble urinating.  Blood in the urine.  Vomiting or being less hungry than normal.  Diarrhea or abdominal pain.  Vaginal discharge, if you are female.  How is this diagnosed? This condition is diagnosed with a medical history and physical exam. You will also need to provide a urine sample to test your urine. Other tests may be done, including:  Blood tests.  Sexually transmitted disease (STD) testing.  If you have had more than one UTI, a cystoscopy or imaging studies may be done to determine the cause of the infections. How is this treated? Treatment for this condition often includes a combination of two or more of the following:  Antibiotic medicine.  Other medicines to treat less common causes of UTI.  Over-the-counter medicines to treat pain.  Drinking enough water to stay hydrated.  Follow these instructions at home:  Take over-the-counter and prescription medicines only as told by your health care provider.  If you were prescribed an antibiotic, take it as told by your health care provider. Do not stop taking the antibiotic even if you start to feel better.  Avoid alcohol, caffeine, tea, and carbonated beverages. They can irritate your bladder.  Drink enough fluid to keep your urine clear or pale yellow.  Keep all follow-up visits as told by your health care provider. This is important.  Make sure to: ? Empty your bladder often and completely. Do not hold urine for long periods of time. ? Empty your bladder before and after sex. ? Wipe from front to back after a bowel movement if you are female. Use each tissue one time when you wipe. Contact a health  care provider if:  You have back pain.  You have a fever.  You feel nauseous or vomit.  Your symptoms do not get better after 3 days.  Your symptoms go away and then return. Get help right away if:  You have severe back pain or lower abdominal pain.  You are vomiting and cannot keep down any medicines or water. This information is not intended to replace advice given to you by your health care provider. Make sure you discuss any questions you have with your health care provider. Document Released: 03/24/2005 Document Revised: 11/26/2015 Document Reviewed: 05/05/2015 Elsevier Interactive Patient Education  [employer].

## 2017-11-07 LAB — CULTURE, OB URINE: CULTURE: NO GROWTH

## 2018-09-12 ENCOUNTER — Emergency Department (HOSPITAL_COMMUNITY)
Admission: EM | Admit: 2018-09-12 | Discharge: 2018-09-12 | Disposition: A | Discharge status: Home / Self Care | Payer: Medicaid Other | Attending: Emergency Medicine | Admitting: Emergency Medicine

## 2018-09-12 ENCOUNTER — Other Ambulatory Visit: Payer: Self-pay

## 2018-09-12 ENCOUNTER — Encounter (HOSPITAL_COMMUNITY): Payer: Self-pay | Admitting: Emergency Medicine

## 2018-09-12 DIAGNOSIS — Z79899 Other long term (current) drug therapy: Secondary | ICD-10-CM | POA: Diagnosis not present

## 2018-09-12 DIAGNOSIS — Z87891 Personal history of nicotine dependence: Secondary | ICD-10-CM | POA: Insufficient documentation

## 2018-09-12 DIAGNOSIS — B349 Viral infection, unspecified: Secondary | ICD-10-CM | POA: Diagnosis not present

## 2018-09-12 DIAGNOSIS — Z91040 Latex allergy status: Secondary | ICD-10-CM | POA: Diagnosis not present

## 2018-09-12 DIAGNOSIS — R05 Cough: Secondary | ICD-10-CM | POA: Diagnosis present

## 2018-09-12 DIAGNOSIS — Z794 Long term (current) use of insulin: Secondary | ICD-10-CM | POA: Insufficient documentation

## 2018-09-12 LAB — GROUP A STREP BY PCR: Group A Strep by PCR: NOT DETECTED

## 2018-09-12 MED ORDER — PROMETHAZINE-DM 6.25-15 MG/5ML PO SYRP: 5.0000 mL | ORAL_SOLUTION | Freq: Four times a day (QID) | ORAL | 0 refills | Status: DC | PRN

## 2018-09-12 MED ORDER — BENZONATATE 100 MG PO CAPS
200.0000 mg | ORAL_CAPSULE | Freq: Once | ORAL | Status: AC
  Administered 2018-09-12: 200 mg via ORAL
  Filled 2018-09-12: qty 2

## 2018-09-12 NOTE — Discharge Instructions (Addendum)
Drink plenty of fluids.  Alternate Tylenol and ibuprofen.  Tylenol is every 4 hours ibuprofen every 6 hours.  You may use over-the-counter Chloraseptic throat spray as directed for your sore throat pain.  Follow-up with your primary doctor for recheck if needed.

## 2018-09-12 NOTE — ED Triage Notes (Signed)
Cough x 3 days and mild body aches Pt fever 103.2 yesterday.  Has not taken temp today but did take ibuprofen x 2 hours ago

## 2018-09-13 NOTE — ED Provider Notes (Signed)
[facility] EMERGENCY DEPARTMENT Provider Note   CSN: [id number] Arrival date & time: 09/12/18  1350    History   Chief Complaint Chief Complaint  Patient presents with  . Influenza    HPI Laurie Warren is a 33 y.o. female.     HPI   Laurie Warren is a 33 y.o. female who presents to the Emergency Department complaining of cough and mild body aches for three days.  Fever at home on the evening prior to arrival reported at 103.2 orally.  She describes her cough as non-productive.  Cough also associated with sore throat.  She has been taking ibuprofen with last dose 3 hours prior to arrival. Cough has been forceful at times.  She denies chest pain, shortness of breath, sore throat, headache and ear pain.  Denies recent travel.      Past Medical History:  Diagnosis Date  . ADD (attention deficit disorder)   . PONV (postoperative nausea and vomiting)   . PPH (postpartum hemorrhage) 06/10/2017  . S/P cesarean section 06/10/2017  . Sepsis ([place]) 2011   hx of  due to untreated bronchitis that was misdiagnosed  . Vaginal Pap smear, abnormal     Patient Active Problem List   Diagnosis Date Noted  . Normal pregnancy in multigravida in third trimester 06/10/2017  . S/P cesarean section 06/10/2017  . PPH (postpartum hemorrhage) 06/10/2017  . Abscess of left genital labia s/p I&D 02/19/2016 02/19/2016    Past Surgical History:  Procedure Laterality Date  . CESAREAN SECTION     8 years ago  . CESAREAN SECTION N/A 06/10/2017   Procedure: REPEAT CESAREAN SECTION;  Surgeon: Janyth Contes, MD;  Location: [place];  Service: Obstetrics;  Laterality: N/A;  [place] K RNFA  . CHOLECYSTECTOMY    . DILATION AND CURETTAGE OF UTERUS N/A 06/10/2017   Procedure: DILATATION AND CURETTAGE;  Surgeon: Janyth Contes, MD;  Location: [place];  Service: Gynecology;  Laterality: N/A;  . IRRIGATION AND DEBRIDEMENT ABSCESS N/A 02/19/2016   Procedure: IRRIGATION AND  DEBRIDEMENT LEFT LABIAL ABSCESS;  Surgeon: Coralie Keens, MD;  Location: [facility];  Service: General;  Laterality: N/A;  . TONSILLECTOMY       OB History    Gravida  2   Para  2   Term  2   Preterm      AB      Living  2     SAB      TAB      Ectopic      Multiple  0   Live Births  2            Home Medications    Prior to Admission medications   Medication Sig Start Date End Date Taking? Authorizing Provider  fluconazole (DIFLUCAN) 150 MG tablet Take 1 tablet (150 mg total) by mouth daily. 11/05/17   Jorje Guild, NP  ibuprofen (ADVIL,MOTRIN) 800 MG tablet Take 1 tablet (800 mg total) by mouth every 8 (eight) hours as needed. 06/13/17   Banga, Lorna Few Worema, DO  insulin glargine (LANTUS) 100 UNIT/ML injection Inject 16 Units into the skin at bedtime.    [provider]  nitrofurantoin, macrocrystal-monohydrate, (MACROBID) 100 MG capsule Take 1 capsule (100 mg total) by mouth 2 (two) times daily. 11/05/17   Jorje Guild, NP  oxyCODONE-acetaminophen (PERCOCET) 5-325 MG tablet Take 1 tablet by mouth every 4 (four) hours as needed for severe pain. 06/13/17   Sherlyn Hay, DO  Prenatal Vit-Fe  Fumarate-FA (PRENATAL PO) Take 1 tablet by mouth at bedtime.    [provider]  promethazine-dextromethorphan (PROMETHAZINE-DM) 6.25-15 MG/5ML syrup Take 5 mLs by mouth 4 (four) times daily as needed. 09/12/18   Arsenio Schnorr, Lynelle Smoke, PA-C    Family History No family history on file.  Social History Social History   Tobacco Use  . Smoking status: Former Smoker    Packs/day: 0.50    Years: 15.00    Pack years: 7.50    Types: Cigarettes    Last attempt to quit: 09/28/2016    Years since quitting: 1.9  . Smokeless tobacco: Never Used  Substance Use Topics  . Alcohol use: Yes    Comment: social drinker  . Drug use: No     Allergies   Sulfa antibiotics; Latex; and Lidocaine   Review of Systems Review of Systems  Constitutional: Positive for  fever. Negative for activity change, appetite change and chills.  HENT: Positive for congestion and sore throat. Negative for facial swelling, rhinorrhea and trouble swallowing.   Eyes: Negative for visual disturbance.  Respiratory: Positive for cough. Negative for shortness of breath, wheezing and stridor.   Cardiovascular: Negative for chest pain.  Gastrointestinal: Negative for abdominal pain, nausea and vomiting.  Genitourinary: Negative for decreased urine volume and dysuria.  Musculoskeletal: Positive for myalgias. Negative for neck pain and neck stiffness.  Skin: Negative for rash.  Neurological: Negative for dizziness, weakness, numbness and headaches.  Hematological: Negative for adenopathy.  Psychiatric/Behavioral: Negative for confusion.     Physical Exam Updated Vital Signs BP 108/77 (BP Location: Right Arm)   Pulse 82   Temp 98.3 F (36.8 C) (Oral)   Resp 16   Ht 5\' 2"  (1.575 m)   Wt 65.8 kg   LMP 09/05/2018 (Approximate)   SpO2 100%   BMI 26.52 kg/m   Physical Exam Vitals signs and nursing note reviewed.  Constitutional:      General: She is not in acute distress.    Appearance: Normal appearance. She is well-developed. She is not ill-appearing.  HENT:     Head: Normocephalic.     Jaw: No trismus.     Right Ear: Tympanic membrane and ear canal normal.     Left Ear: Tympanic membrane and ear canal normal.     Nose: Mucosal edema present. No rhinorrhea.     Mouth/Throat:     Pharynx: Uvula midline. Posterior oropharyngeal erythema present. No pharyngeal swelling, oropharyngeal exudate or uvula swelling.  Eyes:     Conjunctiva/sclera: Conjunctivae normal.  Neck:     Musculoskeletal: Normal range of motion and neck supple.     Trachea: Phonation normal.  Cardiovascular:     Rate and Rhythm: Normal rate and regular rhythm.     Pulses: Normal pulses.     Heart sounds: No murmur.  Pulmonary:     Effort: Pulmonary effort is normal. No respiratory distress.      Breath sounds: Normal breath sounds. No wheezing or rales.  Abdominal:     General: There is no distension.     Palpations: Abdomen is soft.     Tenderness: There is no abdominal tenderness. There is no guarding or rebound.  Musculoskeletal: Normal range of motion.  Lymphadenopathy:     Cervical: No cervical adenopathy.  Skin:    General: Skin is warm.     Findings: No rash.  Neurological:     General: No focal deficit present.     Mental Status: She is alert.  Sensory: No sensory deficit.     Motor: No weakness or abnormal muscle tone.      ED Treatments / Results  Labs (all labs ordered are listed, but only abnormal results are displayed) Labs Reviewed  GROUP A STREP BY PCR    EKG None  Radiology No results found.  Procedures Procedures (including critical care time)  Medications Ordered in ED Medications  benzonatate (TESSALON) capsule 200 mg (200 mg Oral Given 09/12/18 1502)     Initial Impression / Assessment and Plan / ED Course  I have reviewed the triage vital signs and the nursing notes.  Pertinent labs & imaging results that were available during my care of the patient were reviewed by me and considered in my medical decision making (see chart for details).       Pt well appearing.  Vitals reassuring.  Non-toxic.  Strep testing negative.  Cough improved after tessalon. Sx's likely viral.  No recent travel.  Pt appears appropriate for d/c home.  Return precautions discussed  Final Clinical Impressions(s) / ED Diagnoses   Final diagnoses:  Viral syndrome    ED Discharge Orders         Ordered    promethazine-dextromethorphan (PROMETHAZINE-DM) 6.25-15 MG/5ML syrup  4 times daily PRN     09/12/18 1624           Kem Parkinson, PA-C 09/13/18 1815    Isla Pence, MD 09/15/18 [phone]

## 2019-06-29 DIAGNOSIS — N390 Urinary tract infection, site not specified: Secondary | ICD-10-CM

## 2019-06-29 HISTORY — DX: Urinary tract infection, site not specified: N39.0

## 2019-10-19 ENCOUNTER — Encounter (HOSPITAL_COMMUNITY): Payer: Self-pay | Admitting: *Deleted

## 2019-10-19 NOTE — Patient Instructions (Addendum)
Laurie Warren  10/19/2019   Your procedure is scheduled on:  10/29/2019  Arrive at 0800 at Entrance C on [address] at [facility]  and [address]. You are invited to use the FREE valet parking or use the Visitor's parking deck.  Pick up the phone at the desk and dial [phone].  Call this number if you have problems the morning of surgery: [phone]  Remember:   Do not eat food:(After Midnight) Desps de medianoche.  Do not drink clear liquids: (After Midnight) Desps de medianoche.  Take these medicines the morning of surgery with A SIP OF WATER:  13 units of Lantus insulin at bedtime.  NO insulin in the morning.   Do not wear jewelry, make-up or nail polish.  Do not wear lotions, powders, or perfumes. Do not wear deodorant.  Do not shave 48 hours prior to surgery.  Do not bring valuables to the hospital.  [facility] is not   responsible for any belongings or valuables brought to the hospital.  Contacts, dentures or bridgework may not be worn into surgery.  Leave suitcase in the car. After surgery it may be brought to your room.  For patients admitted to the hospital, checkout time is 11:00 AM the day of              discharge.      Please read over the following fact sheets that you were given:     Preparing for Surgery

## 2019-10-22 ENCOUNTER — Encounter (HOSPITAL_COMMUNITY): Payer: Self-pay

## 2019-10-23 ENCOUNTER — Encounter (HOSPITAL_COMMUNITY): Payer: Self-pay | Admitting: Obstetrics and Gynecology

## 2019-10-23 ENCOUNTER — Other Ambulatory Visit: Payer: Self-pay

## 2019-10-23 ENCOUNTER — Inpatient Hospital Stay (HOSPITAL_COMMUNITY)
Admission: AD | Admit: 2019-10-23 | Discharge: 2019-10-26 | DRG: 788 | Disposition: A | Discharge status: Home / Self Care | Payer: Medicaid Other | Attending: Obstetrics and Gynecology | Admitting: Obstetrics and Gynecology

## 2019-10-23 ENCOUNTER — Encounter (HOSPITAL_COMMUNITY)
Admission: AD | Disposition: A | Discharge status: Home / Self Care | Payer: Self-pay | Source: Home / Self Care | Attending: Obstetrics and Gynecology

## 2019-10-23 DIAGNOSIS — O24424 Gestational diabetes mellitus in childbirth, insulin controlled: Secondary | ICD-10-CM | POA: Diagnosis present

## 2019-10-23 DIAGNOSIS — O34211 Maternal care for low transverse scar from previous cesarean delivery: Principal | ICD-10-CM | POA: Diagnosis present

## 2019-10-23 DIAGNOSIS — Z87891 Personal history of nicotine dependence: Secondary | ICD-10-CM

## 2019-10-23 DIAGNOSIS — Z98891 History of uterine scar from previous surgery: Secondary | ICD-10-CM

## 2019-10-23 DIAGNOSIS — Z20822 Contact with and (suspected) exposure to covid-19: Secondary | ICD-10-CM | POA: Diagnosis present

## 2019-10-23 DIAGNOSIS — Z3A38 38 weeks gestation of pregnancy: Secondary | ICD-10-CM

## 2019-10-23 LAB — CBC
HCT: 36.8 % (ref 36.0–46.0)
Hemoglobin: 12.4 g/dL (ref 12.0–15.0)
MCH: 31.4 pg (ref 26.0–34.0)
MCHC: 33.7 g/dL (ref 30.0–36.0)
MCV: 93.2 fL (ref 80.0–100.0)
Platelets: 222 10*3/uL (ref 150–400)
RBC: 3.95 MIL/uL (ref 3.87–5.11)
RDW: 14.5 % (ref 11.5–15.5)
WBC: 9.2 10*3/uL (ref 4.0–10.5)
nRBC: 0 % (ref 0.0–0.2)

## 2019-10-23 LAB — RESPIRATORY PANEL BY RT PCR (FLU A&B, COVID)
Influenza A by PCR: NEGATIVE
Influenza B by PCR: NEGATIVE
SARS Coronavirus 2 by RT PCR: NEGATIVE

## 2019-10-23 LAB — ABO/RH: ABO/RH(D): A POS

## 2019-10-23 LAB — PREPARE RBC (CROSSMATCH)

## 2019-10-23 LAB — GLUCOSE, CAPILLARY: Glucose-Capillary: 75 mg/dL (ref 70–99)

## 2019-10-23 SURGERY — Surgical Case
Anesthesia: Spinal | Wound class: Clean Contaminated

## 2019-10-23 MED ORDER — LACTATED RINGERS IV BOLUS
500.0000 mL | Freq: Once | INTRAVENOUS | Status: AC
  Administered 2019-10-23: 22:00:00 500 mL via INTRAVENOUS

## 2019-10-23 MED ORDER — FAMOTIDINE IN NACL 20-0.9 MG/50ML-% IV SOLN
20.0000 mg | Freq: Once | INTRAVENOUS | Status: AC
  Administered 2019-10-24: 20 mg via INTRAVENOUS
  Filled 2019-10-23: qty 50

## 2019-10-23 MED ORDER — SOD CITRATE-CITRIC ACID 500-334 MG/5ML PO SOLN
30.0000 mL | Freq: Once | ORAL | Status: AC
  Administered 2019-10-24: 30 mL via ORAL
  Filled 2019-10-23: qty 30

## 2019-10-23 MED ORDER — CEFAZOLIN SODIUM-DEXTROSE 2-4 GM/100ML-% IV SOLN
2.0000 g | INTRAVENOUS | Status: AC
  Administered 2019-10-24: 2 g via INTRAVENOUS
  Filled 2019-10-23: qty 100

## 2019-10-23 MED ORDER — LACTATED RINGERS IV SOLN: INTRAVENOUS | Status: DC

## 2019-10-23 SURGICAL SUPPLY — 36 items
BENZOIN TINCTURE PRP APPL 2/3 (GAUZE/BANDAGES/DRESSINGS) ×3 IMPLANT
CHLORAPREP W/TINT 26ML (MISCELLANEOUS) ×3 IMPLANT
CLAMP CORD UMBIL (MISCELLANEOUS) IMPLANT
CLOSURE WOUND 1/2 X4 (GAUZE/BANDAGES/DRESSINGS) ×1
CLOTH BEACON ORANGE TIMEOUT ST (SAFETY) ×3 IMPLANT
DRSG OPSITE POSTOP 4X10 (GAUZE/BANDAGES/DRESSINGS) ×3 IMPLANT
ELECT REM PT RETURN 9FT ADLT (ELECTROSURGICAL) ×3
ELECTRODE REM PT RTRN 9FT ADLT (ELECTROSURGICAL) ×1 IMPLANT
EXTRACTOR VACUUM M CUP 4 TUBE (SUCTIONS) IMPLANT
EXTRACTOR VACUUM M CUP 4' TUBE (SUCTIONS)
GLOVE BIO SURGEON STRL SZ 6.5 (GLOVE) ×2 IMPLANT
GLOVE BIO SURGEONS STRL SZ 6.5 (GLOVE) ×1
GLOVE BIOGEL PI IND STRL 7.0 (GLOVE) ×1 IMPLANT
GLOVE BIOGEL PI INDICATOR 7.0 (GLOVE) ×2
GOWN STRL REUS W/TWL LRG LVL3 (GOWN DISPOSABLE) ×6 IMPLANT
KIT ABG SYR 3ML LUER SLIP (SYRINGE) IMPLANT
NEEDLE HYPO 25X5/8 SAFETYGLIDE (NEEDLE) IMPLANT
NS IRRIG 1000ML POUR BTL (IV SOLUTION) ×3 IMPLANT
PACK C SECTION WH (CUSTOM PROCEDURE TRAY) ×3 IMPLANT
PAD OB MATERNITY 4.3X12.25 (PERSONAL CARE ITEMS) ×3 IMPLANT
PENCIL SMOKE EVAC W/HOLSTER (ELECTROSURGICAL) ×3 IMPLANT
RTRCTR C-SECT PINK 25CM LRG (MISCELLANEOUS) ×3 IMPLANT
STRIP CLOSURE SKIN 1/2X4 (GAUZE/BANDAGES/DRESSINGS) ×2 IMPLANT
SUT MNCRL 0 VIOLET CTX 36 (SUTURE) ×2 IMPLANT
SUT MONOCRYL 0 CTX 36 (SUTURE) ×4
SUT PLAIN 1 NONE 54 (SUTURE) IMPLANT
SUT PLAIN 2 0 XLH (SUTURE) ×3 IMPLANT
SUT VIC AB 0 CT1 27 (SUTURE) ×6
SUT VIC AB 0 CT1 27XBRD ANBCTR (SUTURE) ×3 IMPLANT
SUT VIC AB 2-0 CT1 27 (SUTURE) ×2
SUT VIC AB 2-0 CT1 TAPERPNT 27 (SUTURE) ×1 IMPLANT
SUT VIC AB 4-0 KS 27 (SUTURE) ×3 IMPLANT
SYR BULB IRRIGATION 50ML (SYRINGE) ×3 IMPLANT
TOWEL OR 17X24 6PK STRL BLUE (TOWEL DISPOSABLE) ×3 IMPLANT
TRAY FOLEY W/BAG SLVR 14FR LF (SET/KITS/TRAYS/PACK) ×3 IMPLANT
WATER STERILE IRR 1000ML POUR (IV SOLUTION) ×6 IMPLANT

## 2019-10-23 NOTE — H&P (Signed)
Laurie Warren is a 34 y.o. female [id number] for rLTCS - presents laboring.  D/W pt r/b/a of LTCS inc r/b/a - will proceed.  Flu and Tdap in [facility].  Pt w GDM controlled w Lantus.  H/o tobacco use quit w pregnancy, GERD, h/o LEEP.  Dated by LMP  OB History    Gravida  3   Para  2   Term  2   Preterm      AB      Living  2     SAB      TAB      Ectopic      Multiple  0   Live Births  2         G1 LTCS 2008, female 9# G2 7#15 female rLTCS - PP Hmg G3 present  + Chl + abn pap - last WNL HR HPV neg  Past Medical History:  Diagnosis Date  . ADD (attention deficit disorder)   . PONV (postoperative nausea and vomiting)   . PPH (postpartum hemorrhage) 06/10/2017  . S/P cesarean section 06/10/2017  . Sepsis ([place]) 2011   hx of  due to untreated bronchitis that was misdiagnosed  . Vaginal Pap smear, abnormal    Past Surgical History:  Procedure Laterality Date  . CESAREAN SECTION     8 years ago  . CESAREAN SECTION N/A 06/10/2017   Procedure: REPEAT CESAREAN SECTION;  Surgeon: Janyth Contes, MD;  Location: [place];  Service: Obstetrics;  Laterality: N/A;  [place] K RNFA  . CHOLECYSTECTOMY    . DILATION AND CURETTAGE OF UTERUS N/A 06/10/2017   Procedure: DILATATION AND CURETTAGE;  Surgeon: Janyth Contes, MD;  Location: [place];  Service: Gynecology;  Laterality: N/A;  . IRRIGATION AND DEBRIDEMENT ABSCESS N/A 02/19/2016   Procedure: IRRIGATION AND DEBRIDEMENT LEFT LABIAL ABSCESS;  Surgeon: Coralie Keens, MD;  Location: [facility];  Service: General;  Laterality: N/A;  . TONSILLECTOMY     Family History: family history is not on file. Social History:  reports that she quit smoking about 3 years ago. Her smoking use included cigarettes. She has a 7.50 pack-year smoking history. She has never used smokeless tobacco. She reports current alcohol use. She reports that she does not use drugs.   Meds PNV All Latex, Sulfa     Maternal  Diabetes: Yes:  Diabetes Type:  Insulin/Medication controlled Genetic Screening: Normal Maternal Ultrasounds/Referrals: Normal Fetal Ultrasounds or other Referrals:  None Maternal Substance Abuse:  Yes:  Type: Smoker (quit w pregnancy) Significant Maternal Medications:  None Significant Maternal Lab Results:  Group B Strep negative Other Comments:  None  Review of Systems  Constitutional: Negative.   HENT: Negative.   Eyes: Negative.   Respiratory: Negative.   Cardiovascular: Negative.   Gastrointestinal: Negative.   Genitourinary: Negative.   Musculoskeletal: Positive for back pain.  Skin: Negative.   Neurological: Negative.   Psychiatric/Behavioral: Negative.    Maternal Medical History:  Reason for admission: Contractions.   Contractions: Onset was 3-5 hours ago.   Frequency: regular.   Perceived severity is strong.    Fetal activity: Perceived fetal activity is normal.    Prenatal complications: H/o LTCS x 2, h/o PPHmg  Prenatal Complications - Diabetes: type 2. Diabetes is managed by insulin injections.      Dilation: Fingertip Exam by:: Scherry Ran, RN Blood pressure 121/83, pulse 89, temperature 98.2 F (36.8 C), temperature source Oral, resp. rate 20, height 5\' 2"  (1.575 m), weight 80.9 kg,  last menstrual period 01/28/2019, unknown if currently breastfeeding. Maternal Exam:  Uterine Assessment: Contraction strength is moderate.  Contraction frequency is regular.   Abdomen: Patient reports no abdominal tenderness. Surgical scars: low transverse.   Fundal height is approriate for gestation.   Estimated fetal weight is 7.5-8.5#.   Fetal presentation: vertex  Introitus: Normal vulva. Normal vagina.    Physical Exam  Constitutional: She is oriented to person, place, and time. She appears well-developed and well-nourished.  HENT:  Head: Normocephalic and atraumatic.  Cardiovascular: Normal rate and regular rhythm.  Respiratory: Effort normal and breath  sounds normal. No respiratory distress. She has no wheezes.  GI: Soft. Bowel sounds are normal. She exhibits no distension. There is no abdominal tenderness.  Genitourinary:    Vulva normal.   Musculoskeletal:        General: Normal range of motion.  Neurological: She is alert and oriented to person, place, and time.  Skin: Skin is warm and dry.  Psychiatric: She has a normal mood and affect. Her behavior is normal.    Prenatal labs: ABO, Rh:  A+ Antibody:  neg Rubella:  Nonimmune RPR:   NR HBsAg:   NR HIV:   neg GBS:   neg  Hep C neg/Hgb 13.0/Plt 275/Ur Cx neg/Chl neg/GC neg/Varicella immune/Hgb electro WNL/essential panel neg/panorama low tisk female glucola 176- 3hr GTT  Nl anat, post plac, female  Assessment/Plan: 34yo G3P2002 at 38+ in labor for rLTCS D/w pt r/b/a and process Ancef for prophylaxis H/o PP HMG   Laurie Warren 10/23/2019, 11:12 PM

## 2019-10-23 NOTE — Anesthesia Preprocedure Evaluation (Signed)
Anesthesia Evaluation  Patient identified by MRN, date of birth, ID band Patient awake    Reviewed: Allergy & Precautions, NPO status , Patient's Chart, lab work & pertinent test results  History of Anesthesia Complications (+) PONV  Airway Mallampati: I  TM Distance: >3 FB Neck ROM: Full    Dental no notable dental hx. (+) Teeth Intact, Dental Advisory Given   Pulmonary former smoker,    Pulmonary exam normal breath sounds clear to auscultation       Cardiovascular negative cardio ROS Normal cardiovascular exam Rhythm:Regular Rate:Normal     Neuro/Psych negative neurological ROS  negative psych ROS   GI/Hepatic negative GI ROS, Neg liver ROS,   Endo/Other  negative endocrine ROS  Renal/GU negative Renal ROS     Musculoskeletal negative musculoskeletal ROS (+)   Abdominal   Peds  Hematology Hgb 12.4 Plt 222 T & [address] Pend   Anesthesia Other Findings All: Latex, sulfa, lidocaine  Reproductive/Obstetrics (+) Pregnancy                             Anesthesia Physical Anesthesia Plan  ASA: II  Anesthesia Plan: Spinal   Post-op Pain Management:    Induction: Intravenous  PONV Risk Score and Plan: Treatment may vary due to age or medical condition  Airway Management Planned: Nasal Cannula and Natural Airway  Additional Equipment: None  Intra-op Plan:   Post-operative Plan:   Informed Consent: I have reviewed the patients History and Physical, chart, labs and discussed the procedure including the risks, benefits and alternatives for the proposed anesthesia with the patient or authorized representative who has indicated his/her understanding and acceptance.     Dental advisory given  Plan Discussed with:   Anesthesia Plan Comments: (38.2 Wk G3P2 for Rpt C/S under Spinal )        Anesthesia Quick Evaluation

## 2019-10-23 NOTE — MAU Note (Signed)
PT SAYS AT 630PM- SHE FELT LOWER RIGHT ABD PAIN AND HER BACK HURTS AND  AND RECTUM. VE 2 WEEKS - CLOSED.   [facility] FOR C/S ON Monday

## 2019-10-24 ENCOUNTER — Inpatient Hospital Stay (HOSPITAL_COMMUNITY): Payer: Medicaid Other | Admitting: Anesthesiology

## 2019-10-24 ENCOUNTER — Encounter (HOSPITAL_COMMUNITY): Payer: Self-pay | Admitting: Obstetrics and Gynecology

## 2019-10-24 DIAGNOSIS — O34211 Maternal care for low transverse scar from previous cesarean delivery: Secondary | ICD-10-CM | POA: Diagnosis present

## 2019-10-24 DIAGNOSIS — Z98891 History of uterine scar from previous surgery: Secondary | ICD-10-CM

## 2019-10-24 DIAGNOSIS — Z87891 Personal history of nicotine dependence: Secondary | ICD-10-CM | POA: Diagnosis not present

## 2019-10-24 DIAGNOSIS — O24424 Gestational diabetes mellitus in childbirth, insulin controlled: Secondary | ICD-10-CM | POA: Diagnosis present

## 2019-10-24 DIAGNOSIS — Z3A38 38 weeks gestation of pregnancy: Secondary | ICD-10-CM | POA: Diagnosis not present

## 2019-10-24 DIAGNOSIS — Z20822 Contact with and (suspected) exposure to covid-19: Secondary | ICD-10-CM | POA: Diagnosis present

## 2019-10-24 LAB — GLUCOSE, CAPILLARY: Glucose-Capillary: 83 mg/dL (ref 70–99)

## 2019-10-24 LAB — RPR: RPR Ser Ql: NONREACTIVE

## 2019-10-24 MED ORDER — OXYCODONE HCL 5 MG PO TABS: 5.0000 mg | ORAL_TABLET | ORAL | Status: DC | PRN

## 2019-10-24 MED ORDER — IBUPROFEN 800 MG PO TABS
800.0000 mg | ORAL_TABLET | Freq: Three times a day (TID) | ORAL | Status: DC
  Administered 2019-10-24 – 2019-10-26 (×6): 800 mg via ORAL
  Filled 2019-10-24 (×6): qty 1

## 2019-10-24 MED ORDER — BUPIVACAINE IN DEXTROSE 0.75-8.25 % IT SOLN
INTRATHECAL | Status: DC | PRN
  Administered 2019-10-24: 12 mg via INTRATHECAL

## 2019-10-24 MED ORDER — NALBUPHINE HCL 10 MG/ML IJ SOLN: 5.0000 mg | INTRAMUSCULAR | Status: DC | PRN

## 2019-10-24 MED ORDER — SIMETHICONE 80 MG PO CHEW
80.0000 mg | CHEWABLE_TABLET | ORAL | Status: DC
  Administered 2019-10-24 – 2019-10-25 (×2): 80 mg via ORAL
  Filled 2019-10-24 (×2): qty 1

## 2019-10-24 MED ORDER — ACETAMINOPHEN 325 MG PO TABS
650.0000 mg | ORAL_TABLET | ORAL | Status: DC | PRN
  Administered 2019-10-24 – 2019-10-25 (×3): 650 mg via ORAL
  Filled 2019-10-24 (×3): qty 2

## 2019-10-24 MED ORDER — SIMETHICONE 80 MG PO CHEW
80.0000 mg | CHEWABLE_TABLET | Freq: Three times a day (TID) | ORAL | Status: DC
  Administered 2019-10-24 – 2019-10-26 (×5): 80 mg via ORAL
  Filled 2019-10-24 (×5): qty 1

## 2019-10-24 MED ORDER — MORPHINE SULFATE (PF) 0.5 MG/ML IJ SOLN
INTRAMUSCULAR | Status: DC | PRN
  Administered 2019-10-24: 150 ug via EPIDURAL

## 2019-10-24 MED ORDER — OXYCODONE HCL 5 MG PO TABS: 5.0000 mg | ORAL_TABLET | Freq: Once | ORAL | Status: DC | PRN

## 2019-10-24 MED ORDER — FENTANYL CITRATE (PF) 100 MCG/2ML IJ SOLN
INTRAMUSCULAR | Status: DC | PRN
  Administered 2019-10-24: 15 ug via INTRATHECAL

## 2019-10-24 MED ORDER — KETOROLAC TROMETHAMINE 30 MG/ML IJ SOLN
INTRAMUSCULAR | Status: AC
  Filled 2019-10-24: qty 1

## 2019-10-24 MED ORDER — FENTANYL CITRATE (PF) 100 MCG/2ML IJ SOLN
INTRAMUSCULAR | Status: AC
  Filled 2019-10-24: qty 2

## 2019-10-24 MED ORDER — SODIUM CHLORIDE 0.9% FLUSH: 3.0000 mL | INTRAVENOUS | Status: DC | PRN

## 2019-10-24 MED ORDER — SIMETHICONE 80 MG PO CHEW: 80.0000 mg | CHEWABLE_TABLET | ORAL | Status: DC | PRN

## 2019-10-24 MED ORDER — SENNOSIDES-DOCUSATE SODIUM 8.6-50 MG PO TABS
2.0000 | ORAL_TABLET | ORAL | Status: DC
  Administered 2019-10-24 – 2019-10-25 (×2): 2 via ORAL
  Filled 2019-10-24 (×2): qty 2

## 2019-10-24 MED ORDER — DIPHENHYDRAMINE HCL 50 MG/ML IJ SOLN: 12.5000 mg | INTRAMUSCULAR | Status: DC | PRN

## 2019-10-24 MED ORDER — DIBUCAINE (PERIANAL) 1 % EX OINT: 1.0000 "application " | TOPICAL_OINTMENT | CUTANEOUS | Status: DC | PRN

## 2019-10-24 MED ORDER — TRANEXAMIC ACID-NACL 1000-0.7 MG/100ML-% IV SOLN
INTRAVENOUS | Status: AC
  Filled 2019-10-24: qty 100

## 2019-10-24 MED ORDER — TETANUS-DIPHTH-ACELL PERTUSSIS 5-2.5-18.5 LF-MCG/0.5 IM SUSP: 0.5000 mL | Freq: Once | INTRAMUSCULAR | Status: DC

## 2019-10-24 MED ORDER — DIPHENHYDRAMINE HCL 25 MG PO CAPS: 25.0000 mg | ORAL_CAPSULE | Freq: Four times a day (QID) | ORAL | Status: DC | PRN

## 2019-10-24 MED ORDER — DIPHENHYDRAMINE HCL 25 MG PO CAPS: 25.0000 mg | ORAL_CAPSULE | ORAL | Status: DC | PRN

## 2019-10-24 MED ORDER — METOCLOPRAMIDE HCL 5 MG/ML IJ SOLN
INTRAMUSCULAR | Status: AC
  Filled 2019-10-24: qty 2

## 2019-10-24 MED ORDER — SCOPOLAMINE 1 MG/3DAYS TD PT72
1.0000 | MEDICATED_PATCH | Freq: Once | TRANSDERMAL | Status: DC
  Administered 2019-10-24: 04:00:00 1.5 mg via TRANSDERMAL

## 2019-10-24 MED ORDER — WITCH HAZEL-GLYCERIN EX PADS: 1.0000 "application " | MEDICATED_PAD | CUTANEOUS | Status: DC | PRN

## 2019-10-24 MED ORDER — TRANEXAMIC ACID-NACL 1000-0.7 MG/100ML-% IV SOLN
INTRAVENOUS | Status: DC | PRN
Start: 2019-10-24 — End: 2019-10-24
  Administered 2019-10-24: 1000 mg via INTRAVENOUS

## 2019-10-24 MED ORDER — ONDANSETRON HCL 4 MG/2ML IJ SOLN
4.0000 mg | Freq: Three times a day (TID) | INTRAMUSCULAR | Status: DC | PRN
  Administered 2019-10-24: 01:00:00 4 mg via INTRAVENOUS
  Filled 2019-10-24 (×2): qty 2

## 2019-10-24 MED ORDER — MORPHINE SULFATE (PF) 0.5 MG/ML IJ SOLN
INTRAMUSCULAR | Status: AC
  Filled 2019-10-24: qty 10

## 2019-10-24 MED ORDER — MENTHOL 3 MG MT LOZG: 1.0000 | LOZENGE | OROMUCOSAL | Status: DC | PRN

## 2019-10-24 MED ORDER — ONDANSETRON HCL 4 MG/2ML IJ SOLN
INTRAMUSCULAR | Status: AC
  Filled 2019-10-24: qty 2

## 2019-10-24 MED ORDER — SCOPOLAMINE 1 MG/3DAYS TD PT72
MEDICATED_PATCH | TRANSDERMAL | Status: AC
  Filled 2019-10-24: qty 1

## 2019-10-24 MED ORDER — OXYTOCIN 40 UNITS IN NORMAL SALINE INFUSION - SIMPLE MED
INTRAVENOUS | Status: DC | PRN
Start: 2019-10-24 — End: 2019-10-24
  Administered 2019-10-24: 40 [IU] via INTRAVENOUS

## 2019-10-24 MED ORDER — PHENYLEPHRINE HCL-NACL 20-0.9 MG/250ML-% IV SOLN
INTRAVENOUS | Status: DC | PRN
  Administered 2019-10-24: 60 ug/min via INTRAVENOUS

## 2019-10-24 MED ORDER — METOCLOPRAMIDE HCL 5 MG/ML IJ SOLN
INTRAMUSCULAR | Status: DC | PRN
Start: 2019-10-24 — End: 2019-10-24
  Administered 2019-10-24: 10 mg via INTRAVENOUS

## 2019-10-24 MED ORDER — OXYTOCIN 40 UNITS IN NORMAL SALINE INFUSION - SIMPLE MED
INTRAVENOUS | Status: AC
  Filled 2019-10-24: qty 1000

## 2019-10-24 MED ORDER — ONDANSETRON HCL 4 MG/2ML IJ SOLN
4.0000 mg | Freq: Once | INTRAMUSCULAR | Status: AC | PRN
  Administered 2019-10-24: 4 mg via INTRAVENOUS

## 2019-10-24 MED ORDER — NALBUPHINE HCL 10 MG/ML IJ SOLN
5.0000 mg | INTRAMUSCULAR | Status: DC | PRN
  Administered 2019-10-25: 5 mg via INTRAVENOUS
  Filled 2019-10-24 (×2): qty 1

## 2019-10-24 MED ORDER — ZOLPIDEM TARTRATE 5 MG PO TABS: 5.0000 mg | ORAL_TABLET | Freq: Every evening | ORAL | Status: DC | PRN

## 2019-10-24 MED ORDER — COCONUT OIL OIL
1.0000 "application " | TOPICAL_OIL | Status: DC | PRN
  Administered 2019-10-24: 1 via TOPICAL

## 2019-10-24 MED ORDER — NALOXONE HCL 4 MG/10ML IJ SOLN
1.0000 ug/kg/h | INTRAVENOUS | Status: DC | PRN
  Filled 2019-10-24: qty 5

## 2019-10-24 MED ORDER — PROMETHAZINE HCL 25 MG/ML IJ SOLN
12.5000 mg | Freq: Four times a day (QID) | INTRAMUSCULAR | Status: DC | PRN
  Administered 2019-10-24: 09:00:00 12.5 mg via INTRAVENOUS
  Filled 2019-10-24: qty 1

## 2019-10-24 MED ORDER — NALOXONE HCL 0.4 MG/ML IJ SOLN: 0.4000 mg | INTRAMUSCULAR | Status: DC | PRN

## 2019-10-24 MED ORDER — OXYTOCIN 40 UNITS IN NORMAL SALINE INFUSION - SIMPLE MED: 2.5000 [IU]/h | INTRAVENOUS | Status: AC

## 2019-10-24 MED ORDER — PRENATAL MULTIVITAMIN CH
1.0000 | ORAL_TABLET | Freq: Every day | ORAL | Status: DC
  Administered 2019-10-25 – 2019-10-26 (×2): 1 via ORAL
  Filled 2019-10-24 (×2): qty 1

## 2019-10-24 MED ORDER — PHENYLEPHRINE HCL-NACL 20-0.9 MG/250ML-% IV SOLN
INTRAVENOUS | Status: AC
  Filled 2019-10-24: qty 250

## 2019-10-24 MED ORDER — LACTATED RINGERS IV SOLN: INTRAVENOUS | Status: DC

## 2019-10-24 MED ORDER — NALBUPHINE HCL 10 MG/ML IJ SOLN: 5.0000 mg | Freq: Once | INTRAMUSCULAR | Status: AC | PRN

## 2019-10-24 MED ORDER — OXYCODONE HCL 5 MG/5ML PO SOLN: 5.0000 mg | Freq: Once | ORAL | Status: DC | PRN

## 2019-10-24 MED ORDER — DEXAMETHASONE SODIUM PHOSPHATE 10 MG/ML IJ SOLN
INTRAMUSCULAR | Status: AC
  Filled 2019-10-24: qty 1

## 2019-10-24 MED ORDER — FENTANYL CITRATE (PF) 100 MCG/2ML IJ SOLN
50.0000 ug | Freq: Once | INTRAMUSCULAR | Status: AC
  Administered 2019-10-24: 01:00:00 50 ug via INTRAVENOUS
  Filled 2019-10-24: qty 2

## 2019-10-24 MED ORDER — NALBUPHINE HCL 10 MG/ML IJ SOLN
5.0000 mg | Freq: Once | INTRAMUSCULAR | Status: AC | PRN
  Administered 2019-10-24: 21:00:00 5 mg via INTRAVENOUS

## 2019-10-24 MED ORDER — KETOROLAC TROMETHAMINE 30 MG/ML IJ SOLN
30.0000 mg | Freq: Once | INTRAMUSCULAR | Status: AC | PRN
  Administered 2019-10-24: 30 mg via INTRAVENOUS

## 2019-10-24 MED ORDER — DEXAMETHASONE SODIUM PHOSPHATE 10 MG/ML IJ SOLN
INTRAMUSCULAR | Status: DC | PRN
Start: 2019-10-24 — End: 2019-10-24
  Administered 2019-10-24: 10 mg via INTRAVENOUS

## 2019-10-24 MED ORDER — HYDROMORPHONE HCL 1 MG/ML IJ SOLN: 0.2500 mg | INTRAMUSCULAR | Status: DC | PRN

## 2019-10-24 NOTE — Transfer of Care (Signed)
Immediate Anesthesia Transfer of Care Note  Patient: Laurie Warren  Procedure(s) Performed: CESAREAN SECTION (N/A )  Patient Location: PACU  Anesthesia Type:Spinal  Level of Consciousness: awake, alert , oriented and patient cooperative  Airway & Oxygen Therapy: Patient Spontanous Breathing  Post-op Assessment: Report given to RN and Post -op Vital signs reviewed and stable  Post vital signs: Reviewed and stable  Last Vitals:  Vitals Value Taken Time  BP    Temp    Pulse 82 10/24/19 0337  Resp    SpO2 99 % 10/24/19 0337  Vitals shown include unvalidated device data.  Last Pain:  Vitals:   10/23/19 2144  TempSrc: Oral  PainSc: 4          Complications: No apparent anesthesia complications

## 2019-10-24 NOTE — Op Note (Signed)
Laurie Warren, Laurie Warren MEDICAL RECORD [id number] ACCOUNT [id number] DATE OF BIRTH:05-17-1986 FACILITY: [facility] LOCATION: [place] PHYSICIAN:Manas Hickling BOVARD-STUCKERT, MD  OPERATIVE REPORT  DATE OF PROCEDURE:  10/23/2019  PREOPERATIVE DIAGNOSES:  Intrauterine pregnancy at term, history of low transverse cesarean section x2.  POSTOPERATIVE DIAGNOSES:  Intrauterine pregnancy at term, history of low transverse cesarean section x2, delivered.  PROCEDURE:  Repeat low transverse cesarean section.  SURGEON:  Thornell Sartorius, MD and Barrington Ellison, MD   ANESTHESIA:  Spinal.  INTRAVENOUS FLUIDS AND URINE OUTPUT:  Per anesthesia with clear urine at the end of the procedure.  ESTIMATED BLOOD LOSS:  Approximately 471 mL.  FINDINGS:  Viable female infant at 2:49 a.m. with Apgars of 8 at 1 minute and 9 at 5 minutes and weight of 7 pounds 3 ounces.  Normal uterus, tubes and ovaries are noted.  COMPLICATIONS:  None.  PATHOLOGY:  Placenta to labor and delivery.  DESCRIPTION OF PROCEDURE:  After informed consent was reviewed with the patient including risks, benefits and alternatives of the surgical procedure, the decision was made due to her history of postpartum hemorrhage to delay the procedure to obtain  blood.  Also, as provider was on call at the time of her delivery, several other deliveries postponed this case.  The patient was taken to the operating room where spinal anesthesia was induced and found to be adequate.  She was then turned to supine  position with a leftward tilt, prepped and draped in normal sterile fashion.  A Pfannenstiel skin incision was made at the level of her previous incision and carried through to the underlying layer of fascia sharply.  The fascia was incised in the  midline and the incision was extended laterally with Mayo scissors.  Superior aspect of the fascial incision was grasped with Kocher clamps, elevated and the rectus muscles were dissected off both bluntly and sharply.   Midline was then easily identified  and entered sharply.  This incision was extended superiorly and inferiorly with good visualization of the bladder.  An Alexis skin retractor was placed carefully making sure that no bowel was entrapped.  The bladder flap was created both digitally and  sharply.  The uterus was incised in a transverse fashion.  Infant was delivered from a vertex presentation.  Nose and mouth were suctioned on the field.  Cord was clamped and cut after a minute.  The infant was handed off to awaiting pediatric staff.   Uterus was cleared of all clot and debris.  Uterine incision was closed with 2 layers of 0 Vicryl, 1st of which was running locked and the 2nd as an imbricating layer.  The gutters were cleared of all clot and debris and the normal anatomy was assured.   The retractor was removed.  The peritoneum was reapproximated with 2-0 Vicryl in a running fashion.  Subfascial planes were inspected and found to be hemostatic and then fascia was reapproximated with 0 Vicryl in a running fashion in a single suture.   The subcuticular adipose layer was made hemostatic and the dead space was closed with plain gut.  The skin was closed with 4-0 Vicryl in a running fashion. Benzoin and Steri-Strips were applied.  Sponge, lap and needle counts were correct x2 per the  operating staff.  CN/NUANCE  D:10/24/2019 T:10/24/2019 JOB:[id number]

## 2019-10-24 NOTE — Anesthesia Procedure Notes (Signed)
Spinal  Patient location during procedure: OB Start time: 10/24/2019 2:19 AM End time: 10/24/2019 2:23 AM Staffing Performed: anesthesiologist  Anesthesiologist: Barnet Glasgow, MD Preanesthetic Checklist Completed: patient identified, IV checked, risks and benefits discussed, surgical consent, monitors and equipment checked, pre-op evaluation and timeout performed Spinal Block Patient position: sitting Prep: DuraPrep and site prepped and draped Patient monitoring: heart rate, cardiac monitor, continuous pulse ox and blood pressure Approach: midline Location: L3-4 Injection technique: single-shot Needle Needle type: Pencan  Needle gauge: 24 G Needle length: 10 cm Needle insertion depth: 5 cm Assessment Sensory level: T4 Additional Notes 1 Attempt (s). Pt tolerated procedure well.

## 2019-10-24 NOTE — Progress Notes (Signed)
Post Partum Day 0  Pt just getting to room and having some nausea, phenergan ordered Pain minimal VSS BS normal and was GDM only so CBG not ordered      LOS: 0 days   Laurie Warren 10/24/2019, 10:09 AM

## 2019-10-24 NOTE — Lactation Note (Signed)
This note was copied from a baby's chart. Lactation Consultation Note  Patient Name: Girl Delight Zeger [id number] Date: 10/24/2019 Reason for consult: Initial assessment;Early term 37-38.6wks P3.  Mom states she attempted to breastfeed her last baby but it was difficult.  Baby had a lip tie which made latching difficult.  Mom mostly pumped and bottle fed for 6 weeks.  Newborn is 62 hours old.  Mom is pleased that baby is latching well.  It has been four hours since the last feeding.  Mom just woke up and plans on waking baby.  Encouraged to feed skin to skin with cues.  Instructed to call for assist prn.  Breastfeeding consultation services and support information given and reviewed.  Maternal Data Does the patient have breastfeeding experience prior to this delivery?: Yes  Feeding    LATCH Score                   Interventions    Lactation Tools Discussed/Used     Consult Status Consult Status: Follow-up Date: 10/25/19 Follow-up type: In-patient    Ave Filter 10/24/2019, 2:28 PM

## 2019-10-24 NOTE — Brief Op Note (Signed)
10/24/2019  4:32 AM  PATIENT:  Laurie Warren  34 y.o. female  PRE-OPERATIVE DIAGNOSIS:  repeat c-section  POST-OPERATIVE DIAGNOSIS:  repeat c-section  PROCEDURE:  Procedure(s): CESAREAN SECTION (N/A)  SURGEON:  Surgeon(s) and Role:    * Bovard-Stuckert, Myya Meenach, MD - Primary    * Fair, Marin Shutter, MD - Assisting  ANESTHESIA:   spinal  FINDINGS: viable female infant at 2:49am apgars 8/9, wt 7#3.  Nl uterus, tubes and ovaries   EBL: 471cc uop and IVF per anesthesia   BLOOD ADMINISTERED:none  DRAINS: Urinary Catheter (Foley)   LOCAL MEDICATIONS USED:  NONE  SPECIMEN:  Source of Specimen:  Placenta to L&D  DISPOSITION OF SPECIMEN:  L^D  COUNTS:  YES  TOURNIQUET:  * No tourniquets in log *  DICTATION: .Other Dictation: Dictation Number [id number]  PLAN OF CARE: Admit to inpatient   PATIENT DISPOSITION:  PACU - hemodynamically stable.   Delay start of Pharmacological VTE agent (>24hrs) due to surgical blood loss or risk of bleeding: not applicable

## 2019-10-24 NOTE — Anesthesia Postprocedure Evaluation (Signed)
Anesthesia Post Note  Patient: [name]  Procedure(s) Performed: CESAREAN SECTION (N/A )     Patient location during evaluation: Mother Baby Anesthesia Type: Spinal Level of consciousness: oriented and awake and alert Pain management: pain level controlled Vital Signs Assessment: post-procedure vital signs reviewed and stable Respiratory status: spontaneous breathing and respiratory function stable Cardiovascular status: blood pressure returned to baseline and stable Postop Assessment: no headache, no backache, no apparent nausea or vomiting and able to ambulate Anesthetic complications: no    Last Vitals:  Vitals:   10/24/19 0500 10/24/19 0522  BP: 108/74 104/70  Pulse:  (!) 58  Resp: 16 18  Temp:  36.6 C  SpO2: 98% 98%    Last Pain:  Vitals:   10/24/19 0522  TempSrc: Oral  PainSc: 0-No pain   Pain Goal:                Epidural/Spinal Function Cutaneous sensation: Tingles (10/24/19 0522), Patient able to flex knees: Yes (10/24/19 0522), Patient able to lift hips off bed: No (10/24/19 0522), Back pain beyond tenderness at insertion site: No (10/24/19 0522), Progressively worsening motor and/or sensory loss: No (10/24/19 0522), Bowel and/or bladder incontinence post epidural: No (10/24/19 0522)  Barnet Glasgow

## 2019-10-25 LAB — CBC
HCT: 31.6 % — ABNORMAL LOW (ref 36.0–46.0)
Hemoglobin: 10.4 g/dL — ABNORMAL LOW (ref 12.0–15.0)
MCH: 31.2 pg (ref 26.0–34.0)
MCHC: 32.9 g/dL (ref 30.0–36.0)
MCV: 94.9 fL (ref 80.0–100.0)
Platelets: 136 10*3/uL — ABNORMAL LOW (ref 150–400)
RBC: 3.33 MIL/uL — ABNORMAL LOW (ref 3.87–5.11)
RDW: 14.7 % (ref 11.5–15.5)
WBC: 11.8 10*3/uL — ABNORMAL HIGH (ref 4.0–10.5)
nRBC: 0 % (ref 0.0–0.2)

## 2019-10-25 LAB — BIRTH TISSUE RECOVERY COLLECTION (PLACENTA DONATION)

## 2019-10-25 MED ORDER — TRAMADOL HCL 50 MG PO TABS
50.0000 mg | ORAL_TABLET | Freq: Four times a day (QID) | ORAL | Status: DC | PRN
  Administered 2019-10-25 – 2019-10-26 (×5): 50 mg via ORAL
  Filled 2019-10-25 (×5): qty 1

## 2019-10-25 MED ORDER — ACETAMINOPHEN 500 MG PO TABS
1000.0000 mg | ORAL_TABLET | Freq: Four times a day (QID) | ORAL | Status: DC | PRN
  Administered 2019-10-25 – 2019-10-26 (×4): 1000 mg via ORAL
  Filled 2019-10-25 (×5): qty 2

## 2019-10-25 NOTE — Progress Notes (Signed)
Subjective: Postpartum Day #1: Cesarean Delivery Patient reports incisional pain, tolerating PO and no problems voiding.  Wants something other than oxycodone for pain  Objective: Vital signs in last 24 hours: Temp:  [97.5 F (36.4 C)-98.3 F (36.8 C)] 98.2 F (36.8 C) (04/29 0548) Pulse Rate:  [53-65] 65 (04/29 0548) Resp:  [20] 20 (04/29 0548) BP: (91-107)/(61-73) 101/64 (04/29 0548) SpO2:  [97 %-100 %] 98 % (04/28 1620)  Physical Exam:  General: alert Lochia: appropriate Uterine Fundus: firm Incision: dressing C/D/I   Recent Labs    10/23/19 2305 10/25/19 0642  HGB 12.4 10.4*  HCT 36.8 31.6*    Assessment/Plan: Status post Cesarean section. Doing well postoperatively.  Continue current care, ambulate, will change oxycodone to Tramadol and see how she does with that.  Laurie Warren 10/25/2019, 7:41 AM

## 2019-10-25 NOTE — Plan of Care (Signed)
  Problem: Pain Managment: Goal: General experience of comfort will improve Note: MD ordered ultram for pain control. Patient states the ultram improved her pain significantly. Encouraged patient to call for tylenol as needed and increase ambulation. Maxwell Caul, Leretha Dykes [place]

## 2019-10-26 ENCOUNTER — Encounter (HOSPITAL_COMMUNITY): Payer: Self-pay

## 2019-10-26 LAB — TYPE AND SCREEN
ABO/RH(D): A POS
Antibody Screen: NEGATIVE
Unit division: 0
Unit division: 0

## 2019-10-26 LAB — BPAM RBC
Blood Product Expiration Date: 202105262359
Blood Product Expiration Date: 202105272359
ISSUE DATE / TIME: 202104280231
ISSUE DATE / TIME: 202104280231
Unit Type and Rh: 6200
Unit Type and Rh: 6200

## 2019-10-26 MED ORDER — TRAMADOL HCL 50 MG PO TABS: 50.0000 mg | ORAL_TABLET | Freq: Four times a day (QID) | ORAL | 0 refills | Status: AC | PRN

## 2019-10-26 MED ORDER — MEASLES, MUMPS & RUBELLA VAC IJ SOLR
0.5000 mL | Freq: Once | INTRAMUSCULAR | Status: AC
  Administered 2019-10-26: 10:00:00 0.5 mL via SUBCUTANEOUS

## 2019-10-26 MED ORDER — IBUPROFEN 800 MG PO TABS: 800.0000 mg | ORAL_TABLET | Freq: Three times a day (TID) | ORAL | 1 refills | Status: DC | PRN

## 2019-10-26 NOTE — Progress Notes (Signed)
Subjective: Postpartum Day 2: Cesarean Delivery Patient reports tolerating PO, + flatus and no problems voiding.  Pain better controlled with ultram than oxycodone. No BM yet. Lochia mild. Bonding well with baby. Requests early discharge to home today  Objective: Vital signs in last 24 hours: Temp:  [97.6 F (36.4 C)-97.9 F (36.6 C)] 97.6 F (36.4 C) (04/30 0615) Pulse Rate:  [68-83] 78 (04/30 0615) Resp:  [18] 18 (04/30 0615) BP: (94-106)/(55-72) 94/55 (04/30 0615) SpO2:  [97 %-99 %] 97 % (04/30 0615)  Physical Exam:  General: alert, cooperative and no distress Lochia: appropriate Uterine Fundus: firm, moderate distension, nontender Incision: no significant erythema, old dried blood over a third of dressing DVT Evaluation: No evidence of DVT seen on physical exam. Calf/Ankle edema is present.  Recent Labs    10/23/19 2305 10/25/19 0642  HGB 12.4 10.4*  HCT 36.8 31.6*    Assessment/Plan: Status post Cesarean section. Doing well postoperatively. Change dressing  Discharge home with standard precautions and return to clinic in 2 weeks and 6 weeks.  Isaiah Serge 10/26/2019, 9:12 AM

## 2019-10-26 NOTE — Discharge Instructions (Signed)
Call office with any concerns [phone] 

## 2019-10-26 NOTE — Lactation Note (Signed)
This note was copied from a baby's chart. Lactation Consultation Note  Patient Name: Girl Rosslynn Tennis [id number] Date: 10/26/2019 Reason for consult: Follow-up assessment  Infant is 33 hrs old & is at 10% weight loss, but has had excellent output. Mom's milk has come to volume. Infant was observed to have a 1:1 suck: swallow ratio when at the breast. Infant fed until satiated; I have no concerns about infant's feedings.  Other notes pertaining to feedings: Mom was doing 15 min on 1 breast and then switching; I encouraged her to "finish the 1st breast first" & then pump the other breast for comfort, if needed. Mom has a Medela pump at home.   When Sterling released the latch, Mom's nipple was crimped. Mom was comfortable during latch except for initial tenderness. Based on the sounds of infant's swallows, it is possible that Mom's nipple shape would not be distorted at the end of the feeding if Mom were to feed in a more laid-back position, which I demonstrated for Mom.  Mom was prescribed Ultram 50mg  p6h prn. Thomas Hale's "Medications & Mother's Milk" categorizes it as an L4--potentially hazardous. I made Dr. Jimmye Norman, pediatrician aware. Although LactMed gives Ultram a more reassuring profile, I did give Mom indications for calling pediatrician: difficulty with breathing/breathing changes, feeding difficulties, constipation, etc. Mom inquired what she could take instead, which I shared with her.   Matthias Hughs [facility] 10/26/2019, 7:39 AM

## 2019-10-26 NOTE — Discharge Summary (Signed)
OB Discharge Summary     Patient Name: Laurie Warren DOB: [birth year] MRN: [id number]  Date of admission: 10/23/2019 Delivering MD: Janyth Contes   Date of discharge: 10/26/2019  Admitting diagnosis: Status post repeat low transverse cesarean section [Z98.891] Intrauterine pregnancy: [redacted]w[redacted]d     Secondary diagnosis:  Principal Problem:   Status post repeat low transverse cesarean section  Additional problems: GDMA2     Discharge diagnosis: Term Pregnancy Delivered and GDM A2                                                                                                Post partum procedures:none  Augmentation: n/a  Complications: None  Hospital course:  Sceduled C/S   34 y.o. yo G3P2002 at [redacted]w[redacted]d was admitted to the hospital 10/23/2019 for cesarean section with the following indication:Elective Repeat and active labor.  Membrane Rupture Time/Date: 2:48 AM ,10/24/2019   Patient delivered a Viable infant.10/24/2019  Details of operation can be found in separate operative note.  Pateint had an uncomplicated postpartum course.  She is ambulating, tolerating a regular diet, passing flatus, and urinating well. Patient is discharged home in stable condition on  10/26/19         Physical exam  Vitals:   10/25/19 0548 10/25/19 1538 10/25/19 2107 10/26/19 0615  BP: 101/64 102/72 106/69 (!) 94/55  Pulse: 65 68 83 78  Resp: 20 18  18   Temp: 98.2 F (36.8 C) 97.9 F (36.6 C) 97.6 F (36.4 C) 97.6 F (36.4 C)  TempSrc: Oral Oral Oral Oral  SpO2:  99% 99% 97%  Weight:      Height:       General: alert, cooperative and no distress Lochia: appropriate Uterine Fundus: firm Incision: moderate old blood DVT Evaluation: No evidence of DVT seen on physical exam. Labs: Lab Results  Component Value Date   WBC 11.8 (H) 10/25/2019   HGB 10.4 (L) 10/25/2019   HCT 31.6 (L) 10/25/2019   MCV 94.9 10/25/2019   PLT 136 (L) 10/25/2019   CMP Latest Ref Rng & Units 02/20/2016  Glucose 65  - 99 mg/dL 115(H)  BUN 6 - 20 mg/dL 11  Creatinine 0.44 - 1.00 mg/dL 0.65  Sodium 135 - 145 mmol/L 137  Potassium 3.5 - 5.1 mmol/L 4.4  Chloride 101 - 111 mmol/L 107  CO2 22 - 32 mmol/L 23  Calcium 8.9 - 10.3 mg/dL 9.2    Discharge instruction: per After Visit Summary and "Baby and Me Booklet".  After visit meds:  Allergies as of 10/26/2019      Reactions   Sulfa Antibiotics Anaphylaxis   Latex Swelling, Other (See Comments)   Redness.    Lidocaine Palpitations      Medication List    STOP taking these medications   fluconazole 150 MG tablet Commonly known as: Diflucan   Lantus 100 UNIT/ML injection Generic drug: insulin glargine   nitrofurantoin (macrocrystal-monohydrate) 100 MG capsule Commonly known as: MACROBID   oxyCODONE-acetaminophen 5-325 MG tablet Commonly known as: Percocet     TAKE these medications   ibuprofen 800 MG tablet Commonly  known as: ADVIL Take 1 tablet (800 mg total) by mouth every 8 (eight) hours as needed for cramping. What changed: reasons to take this   PRENATAL PO Take 1 tablet by mouth at bedtime.   promethazine-dextromethorphan 6.25-15 MG/5ML syrup Commonly known as: PROMETHAZINE-DM Take 5 mLs by mouth 4 (four) times daily as needed.   traMADol 50 MG tablet Commonly known as: ULTRAM Take 1 tablet (50 mg total) by mouth every 6 (six) hours as needed for up to 7 days for severe pain.            Discharge Care Instructions  (From admission, onward)         Start     Ordered   10/26/19 0000  Change dressing     10/26/19 0919          Diet: routine diet  Activity: Advance as tolerated. Pelvic rest for 6 weeks.   Outpatient follow up:2weeks for incision check and 6 weeks for postpartum visit Follow up Appt: Future Appointments  Date Time Provider [place]  10/27/2019  8:40 AM [facility] 1 [facility] None   Follow up Visit:No follow-ups on file.  Postpartum contraception: Not Discussed  Newborn Data: Live born  female  Birth Weight: 7 lb 3.4 oz (3272 g) APGAR: 44, 9  Newborn Delivery   Birth date/time: 10/24/2019 02:49:00 Delivery type: C-Section, Low Transverse Trial of labor: No C-section categorization: Repeat      Baby Feeding: Breast Disposition:home with mother   10/26/2019 Isaiah Serge, DO

## 2019-10-27 ENCOUNTER — Other Ambulatory Visit (HOSPITAL_COMMUNITY)
Admission: RE | Admit: 2019-10-27 | Discharge: 2019-10-27 | Disposition: A | Discharge status: Home / Self Care | Payer: Medicaid Other | Source: Ambulatory Visit | Attending: Obstetrics and Gynecology | Admitting: Obstetrics and Gynecology

## 2019-10-29 ENCOUNTER — Inpatient Hospital Stay (HOSPITAL_COMMUNITY)
Admission: RE | Admit: 2019-10-29 | Payer: Medicaid Other | Source: Home / Self Care | Admitting: Obstetrics and Gynecology

## 2020-02-10 ENCOUNTER — Ambulatory Visit
Admission: EM | Admit: 2020-02-10 | Discharge: 2020-02-10 | Disposition: A | Discharge status: Home / Self Care | Payer: Medicaid Other | Attending: Emergency Medicine | Admitting: Emergency Medicine

## 2020-02-10 ENCOUNTER — Encounter: Payer: Self-pay | Admitting: Emergency Medicine

## 2020-02-10 ENCOUNTER — Other Ambulatory Visit: Payer: Self-pay

## 2020-02-10 DIAGNOSIS — R300 Dysuria: Secondary | ICD-10-CM | POA: Diagnosis present

## 2020-02-10 DIAGNOSIS — R110 Nausea: Secondary | ICD-10-CM | POA: Diagnosis present

## 2020-02-10 LAB — POCT URINALYSIS DIP (MANUAL ENTRY)
Bilirubin, UA: NEGATIVE
Glucose, UA: NEGATIVE mg/dL
Leukocytes, UA: NEGATIVE
Nitrite, UA: NEGATIVE
Protein Ur, POC: NEGATIVE mg/dL
Spec Grav, UA: >=1.03 — AB (ref 1.010–1.025)
Urobilinogen, UA: 0.2 E.U./dL
pH, UA: 6 (ref 5.0–8.0)

## 2020-02-10 MED ORDER — ONDANSETRON 4 MG PO TBDP: 4.0000 mg | ORAL_TABLET | Freq: Three times a day (TID) | ORAL | 0 refills | Status: DC | PRN

## 2020-02-10 MED ORDER — PHENAZOPYRIDINE HCL 100 MG PO TABS
100.0000 mg | ORAL_TABLET | Freq: Three times a day (TID) | ORAL | 0 refills | Status: DC | PRN
Start: 2020-02-10 — End: 2020-04-14

## 2020-02-10 NOTE — ED Provider Notes (Signed)
[facility]   CC: Burning with urination  SUBJECTIVE:  Laurie Warren is a 34 y.o. female who presented to the urgent care for complaint of dysuria and nuasea for the past 2 weeks.  Has complained to run antibiotic.  States she has continued to have mild dysuria with nausea.  Patient denies a precipitating event, recent sexual encounter. Has tried OTC medications without relief.  Symptoms are made worse with urination.  Admits to similar symptoms in the past.  Denies fever, chills, nausea, vomiting, abdominal pain, flank pain, abnormal vaginal discharge or bleeding, hematuria.    LMP: Patient's last menstrual period was 01/30/2020.  ROS: As in HPI.  All other pertinent ROS negative.     Past Medical History:  Diagnosis Date  . ADD (attention deficit disorder)   . PONV (postoperative nausea and vomiting)   . PPH (postpartum hemorrhage) 06/10/2017  . S/P cesarean section 06/10/2017  . Sepsis ([place]) 2011   hx of  due to untreated bronchitis that was misdiagnosed  . Status post repeat low transverse cesarean section 10/24/2019  . Vaginal Pap smear, abnormal    Past Surgical History:  Procedure Laterality Date  . CESAREAN SECTION     8 years ago  . CESAREAN SECTION N/A 06/10/2017   Procedure: REPEAT CESAREAN SECTION;  Surgeon: Janyth Contes, MD;  Location: [facility];  Service: Obstetrics;  Laterality: N/A;  [place] K RNFA  . CESAREAN SECTION N/A 10/23/2019   Procedure: CESAREAN SECTION;  Surgeon: Janyth Contes, MD;  Location: [place] [facility];  Service: Obstetrics;  Laterality: N/A;  . CHOLECYSTECTOMY    . DILATION AND CURETTAGE OF UTERUS N/A 06/10/2017   Procedure: DILATATION AND CURETTAGE;  Surgeon: Janyth Contes, MD;  Location: [facility];  Service: Gynecology;  Laterality: N/A;  . IRRIGATION AND DEBRIDEMENT ABSCESS N/A 02/19/2016   Procedure: IRRIGATION AND DEBRIDEMENT LEFT LABIAL ABSCESS;  Surgeon: Coralie Keens, MD;  Location: [facility];   Service: General;  Laterality: N/A;  . TONSILLECTOMY     Allergies  Allergen Reactions  . Sulfa Antibiotics Anaphylaxis  . Latex Swelling and Other (See Comments)    Redness.   . Lidocaine Palpitations   No current facility-administered medications on file prior to encounter.   Current Outpatient Medications on File Prior to Encounter  Medication Sig Dispense Refill  . ibuprofen (ADVIL) 800 MG tablet Take 1 tablet (800 mg total) by mouth every 8 (eight) hours as needed for cramping. 30 tablet 1  . Prenatal Vit-Fe Fumarate-FA (PRENATAL PO) Take 1 tablet by mouth at bedtime.    . promethazine-dextromethorphan (PROMETHAZINE-DM) 6.25-15 MG/5ML syrup Take 5 mLs by mouth 4 (four) times daily as needed. 118 mL 0   Social History   Socioeconomic History  . Marital status: Significant Other    Spouse name: Not on file  . Number of children: Not on file  . Years of education: Not on file  . Highest education level: Not on file  Occupational History  . Not on file  Tobacco Use  . Smoking status: Former Smoker    Packs/day: 0.50    Years: 15.00    Pack years: 7.50    Types: Cigarettes    Quit date: 09/28/2016    Years since quitting: 3.3  . Smokeless tobacco: Never Used  Substance and Sexual Activity  . Alcohol use: Yes    Comment: social drinker  . Drug use: No  . Sexual activity: Yes  Other Topics Concern  . Not on file  Social History Narrative  . Not on file   Social Determinants of Health   Financial Resource Strain:   . Difficulty of Paying Living Expenses:   Food Insecurity:   . Worried About Charity fundraiser in the Last Year:   . Arboriculturist in the Last Year:   Transportation Needs:   . Film/video editor (Medical):   Marland Kitchen Lack of Transportation (Non-Medical):   Physical Activity:   . Days of Exercise per Week:   . Minutes of Exercise per Session:   Stress:   . Feeling of Stress :   Social Connections:   . Frequency of Communication with Friends and  Family:   . Frequency of Social Gatherings with Friends and Family:   . Attends Religious Services:   . Active Member of Clubs or Organizations:   . Attends Archivist Meetings:   Marland Kitchen Marital Status:   Intimate Partner Violence:   . Fear of Current or Ex-Partner:   . Emotionally Abused:   Marland Kitchen Physically Abused:   . Sexually Abused:    No family history on file.  OBJECTIVE:  Vitals:   02/10/20 1206 02/10/20 1208  BP:  105/73  Pulse:  85  Resp:  18  Temp:  98.7 F (37.1 C)  TempSrc:  Oral  SpO2:  97%  Weight: 150 lb (68 kg)   Height: 5\' 2"  (1.575 m)    General appearance: AOx3 in no acute distress HEENT: NCAT.  Oropharynx clear.  Lungs: clear to auscultation bilaterally without adventitious breath sounds Heart: regular rate and rhythm.  Radial pulses 2+ symmetrical bilaterally Abdomen: soft; non-distended; no tenderness; bowel sounds present; no guarding or rebound tenderness Back: Left CVA tenderness Extremities: no edema; symmetrical with no gross deformities Skin: warm and dry Neurologic: Ambulates from chair to exam table without difficulty Psychological: alert and cooperative; normal mood and affect  Labs Reviewed  POCT URINALYSIS DIP (MANUAL ENTRY) - Abnormal; Notable for the following components:      Result Value   Ketones, POC UA trace (5) (*)    Spec Grav, UA >=1.030 (*)    Blood, UA moderate (*)    All other components within normal limits  URINE CULTURE    ASSESSMENT & PLAN:  1. Dysuria   2. Nausea without vomiting     Meds ordered this encounter  Medications  . phenazopyridine (PYRIDIUM) 100 MG tablet    Sig: Take 1 tablet (100 mg total) by mouth 3 (three) times daily as needed for pain.    Dispense:  10 tablet    Refill:  0  . ondansetron (ZOFRAN-ODT) 4 MG disintegrating tablet    Sig: Take 1 tablet (4 mg total) by mouth every 8 (eight) hours as needed for nausea or vomiting.    Dispense:  20 tablet    Refill:  0   Discharge  instructions Urine culture sent.  We will call you with the results.   Push fluids and get plenty of rest.   Zofran were prescribed for nausea Take pyridium as prescribed and as needed for symptomatic relief Follow up with PCP if symptoms persists Return here or go to ER if you have any new or worsening symptoms such as fever, worsening abdominal pain, nausea/vomiting, flank pain, etc...  Outlined signs and symptoms indicating need for more acute intervention. Patient verbalized understanding. After Visit Summary given.  Note: This document was prepared using Dragon voice recognition software and may include unintentional dictation errors.  Emerson Monte, [place] 02/10/20 1233

## 2020-02-10 NOTE — Discharge Instructions (Addendum)
Urine culture sent.  We will call you with the results.   Push fluids and get plenty of rest.   Take pyridium as prescribed and as needed for symptomatic relief Follow up with PCP if symptoms persists Return here or go to ER if you have any new or worsening symptoms such as fever, worsening abdominal pain, nausea/vomiting, flank pain, etc..Marland Kitchen

## 2020-02-10 NOTE — ED Triage Notes (Signed)
Burning and frequency with urination x 2 weeks.  Pt has had 2 rounds of abx, cipro and nitrofurantoin with no relief.

## 2020-02-11 LAB — URINE CULTURE: Culture: NO GROWTH

## 2020-03-25 ENCOUNTER — Telehealth: Payer: Self-pay | Admitting: Genetic Counselor

## 2020-03-25 NOTE — Telephone Encounter (Signed)
Received a genetic counseling referral from Dr. Melba Coon for fhx of ovarian cancer. [name] has been cld and scheduled to see Raquel Sarna on 10/19 at 11am. Pt aware to arrive 15 minutes early.

## 2020-04-14 ENCOUNTER — Ambulatory Visit
Admission: EM | Admit: 2020-04-14 | Discharge: 2020-04-14 | Disposition: A | Discharge status: Home / Self Care | Payer: Medicaid Other | Attending: Emergency Medicine | Admitting: Emergency Medicine

## 2020-04-14 ENCOUNTER — Other Ambulatory Visit: Payer: Self-pay

## 2020-04-14 ENCOUNTER — Encounter: Payer: Self-pay | Admitting: Emergency Medicine

## 2020-04-14 DIAGNOSIS — R300 Dysuria: Secondary | ICD-10-CM

## 2020-04-14 LAB — POCT URINALYSIS DIP (MANUAL ENTRY)
Bilirubin, UA: NEGATIVE
Blood, UA: NEGATIVE
Glucose, UA: NEGATIVE mg/dL
Ketones, POC UA: NEGATIVE mg/dL
Nitrite, UA: NEGATIVE
Spec Grav, UA: 1.02 (ref 1.010–1.025)
Urobilinogen, UA: 0.2 E.U./dL
pH, UA: 8.5 — AB (ref 5.0–8.0)

## 2020-04-14 MED ORDER — NITROFURANTOIN MONOHYD MACRO 100 MG PO CAPS
100.0000 mg | ORAL_CAPSULE | Freq: Two times a day (BID) | ORAL | 0 refills | Status: DC
Start: 2020-04-14 — End: 2020-05-05

## 2020-04-14 MED ORDER — PHENAZOPYRIDINE HCL 100 MG PO TABS
100.0000 mg | ORAL_TABLET | Freq: Three times a day (TID) | ORAL | 0 refills | Status: DC | PRN
Start: 2020-04-14 — End: 2020-05-05

## 2020-04-14 NOTE — ED Provider Notes (Signed)
[facility]   CC: Burning with urination  SUBJECTIVE:  Laurie Warren is a 34 y.o. female who presented to the urgent care with a complaint of dysuria for the past 2 to 3 days.  Patient denies a precipitating event, recent sexual encounter, abnormal vaginal discharge or smell, excessive caffeine intake.  Has tried OTC medications without relief.  Symptoms are made worse with urination.  Admits to similar symptoms in the past.  Denies fever, chills, nausea, vomiting, abdominal pain, flank pain, abnormal vaginal discharge or bleeding, hematuria.    LMP: No LMP recorded.  ROS: As in HPI.  All other pertinent ROS negative.     Past Medical History:  Diagnosis Date  . ADD (attention deficit disorder)   . PONV (postoperative nausea and vomiting)   . PPH (postpartum hemorrhage) 06/10/2017  . S/P cesarean section 06/10/2017  . Sepsis ([place]) 2011   hx of  due to untreated bronchitis that was misdiagnosed  . Status post repeat low transverse cesarean section 10/24/2019  . Vaginal Pap smear, abnormal    Past Surgical History:  Procedure Laterality Date  . CESAREAN SECTION     8 years ago  . CESAREAN SECTION N/A 06/10/2017   Procedure: REPEAT CESAREAN SECTION;  Surgeon: Janyth Contes, MD;  Location: [facility];  Service: Obstetrics;  Laterality: N/A;  [place] K RNFA  . CESAREAN SECTION N/A 10/23/2019   Procedure: CESAREAN SECTION;  Surgeon: Janyth Contes, MD;  Location: [place];  Service: Obstetrics;  Laterality: N/A;  . CHOLECYSTECTOMY    . DILATION AND CURETTAGE OF UTERUS N/A 06/10/2017   Procedure: DILATATION AND CURETTAGE;  Surgeon: Janyth Contes, MD;  Location: [facility];  Service: Gynecology;  Laterality: N/A;  . IRRIGATION AND DEBRIDEMENT ABSCESS N/A 02/19/2016   Procedure: IRRIGATION AND DEBRIDEMENT LEFT LABIAL ABSCESS;  Surgeon: Coralie Keens, MD;  [facility];  Service: General;  Laterality: N/A;  . TONSILLECTOMY      Allergies  Allergen Reactions  . Sulfa Antibiotics Anaphylaxis  . Latex Swelling and Other (See Comments)    Redness.   . Lidocaine Palpitations   No current facility-administered medications on file prior to encounter.   Current Outpatient Medications on File Prior to Encounter  Medication Sig Dispense Refill  . ibuprofen (ADVIL) 800 MG tablet Take 1 tablet (800 mg total) by mouth every 8 (eight) hours as needed for cramping. 30 tablet 1  . ondansetron (ZOFRAN-ODT) 4 MG disintegrating tablet Take 1 tablet (4 mg total) by mouth every 8 (eight) hours as needed for nausea or vomiting. 20 tablet 0  . Prenatal Vit-Fe Fumarate-FA (PRENATAL PO) Take 1 tablet by mouth at bedtime.    . promethazine-dextromethorphan (PROMETHAZINE-DM) 6.25-15 MG/5ML syrup Take 5 mLs by mouth 4 (four) times daily as needed. 118 mL 0   Social History   Socioeconomic History  . Marital status: Significant Other    Spouse name: Not on file  . Number of children: Not on file  . Years of education: Not on file  . Highest education level: Not on file  Occupational History  . Not on file  Tobacco Use  . Smoking status: Former Smoker    Packs/day: 0.50    Years: 15.00    Pack years: 7.50    Types: Cigarettes    Quit date: 09/28/2016    Years since quitting: 3.5  . Smokeless tobacco: Never Used  Substance and Sexual Activity  . Alcohol use: Yes    Comment: social drinker  .  Drug use: No  . Sexual activity: Yes  Other Topics Concern  . Not on file  Social History Narrative  . Not on file   Social Determinants of Health   Financial Resource Strain:   . Difficulty of Paying Living Expenses: Not on file  Food Insecurity:   . Worried About Charity fundraiser in the Last Year: Not on file  . Ran Out of Food in the Last Year: Not on file  Transportation Needs:   . Lack of Transportation (Medical): Not on file  . Lack of Transportation (Non-Medical): Not on file  Physical Activity:   . Days of Exercise  per Week: Not on file  . Minutes of Exercise per Session: Not on file  Stress:   . Feeling of Stress : Not on file  Social Connections:   . Frequency of Communication with Friends and Family: Not on file  . Frequency of Social Gatherings with Friends and Family: Not on file  . Attends Religious Services: Not on file  . Active Member of Clubs or Organizations: Not on file  . Attends Archivist Meetings: Not on file  . Marital Status: Not on file  Intimate Partner Violence:   . Fear of Current or Ex-Partner: Not on file  . Emotionally Abused: Not on file  . Physically Abused: Not on file  . Sexually Abused: Not on file   No family history on file.  OBJECTIVE:  Vitals:   04/14/20 1308  BP: 117/76  Pulse: 85  Resp: 16  Temp: 97.9 F (36.6 C)  TempSrc: Oral  SpO2: 98%   General appearance: AOx3 in no acute distress HEENT: NCAT.  Oropharynx clear.  Lungs: clear to auscultation bilaterally without adventitious breath sounds Heart: regular rate and rhythm.  Radial pulses 2+ symmetrical bilaterally Abdomen: soft; non-distended; no tenderness; bowel sounds present; no guarding or rebound tenderness Back: no CVA tenderness Extremities: no edema; symmetrical with no gross deformities Skin: warm and dry Neurologic: Ambulates from chair to exam table without difficulty Psychological: alert and cooperative; normal mood and affect  Labs Reviewed  POCT URINALYSIS DIP (MANUAL ENTRY) - Abnormal; Notable for the following components:      Result Value   pH, UA 8.5 (*)    Protein Ur, POC trace (*)    Leukocytes, UA Trace (*)    All other components within normal limits  URINE CULTURE    ASSESSMENT & PLAN:  1. Dysuria     Meds ordered this encounter  Medications  . nitrofurantoin, macrocrystal-monohydrate, (MACROBID) 100 MG capsule    Sig: Take 1 capsule (100 mg total) by mouth 2 (two) times daily.    Dispense:  10 capsule    Refill:  0  . phenazopyridine (PYRIDIUM)  100 MG tablet    Sig: Take 1 tablet (100 mg total) by mouth 3 (three) times daily as needed for pain.    Dispense:  10 tablet    Refill:  0   Patient is stable at discharge.  POCT urinalysis was inconclusive for UTI, however will prescribe Pyridium and Macrobid as she issymptomatic.  Will await urine culture.  Discharge instructions  Urine culture sent.  We will call you with the results.   Push fluids and get plenty of rest.   Take antibiotic as directed and to completion Take pyridium as prescribed and as needed for symptomatic relief Follow up with PCP if symptoms persists Return here or go to ER if you have any new or  worsening symptoms such as fever, worsening abdominal pain, nausea/vomiting, flank pain, etc...  Outlined signs and symptoms indicating need for more acute intervention. Patient verbalized understanding. After Visit Summary given.     Emerson Monte, [place] 04/14/20 1358

## 2020-04-14 NOTE — Discharge Instructions (Addendum)
Urine culture sent.  We will call you with the results.   Push fluids and get plenty of rest.   Take antibiotic as directed and to completion Take pyridium as prescribed and as needed for symptomatic relief Follow up with PCP if symptoms persists Return here or go to ER if you have any new or worsening symptoms such as fever, worsening abdominal pain, nausea/vomiting, flank pain, etc... 

## 2020-04-15 ENCOUNTER — Other Ambulatory Visit: Payer: Self-pay

## 2020-04-15 ENCOUNTER — Inpatient Hospital Stay: Payer: Medicaid Other | Attending: Genetic Counselor | Admitting: Genetic Counselor

## 2020-04-15 ENCOUNTER — Inpatient Hospital Stay: Payer: Medicaid Other

## 2020-04-15 DIAGNOSIS — Z800 Family history of malignant neoplasm of digestive organs: Secondary | ICD-10-CM

## 2020-04-15 DIAGNOSIS — Z8051 Family history of malignant neoplasm of kidney: Secondary | ICD-10-CM | POA: Diagnosis not present

## 2020-04-15 DIAGNOSIS — Z8481 Family history of carrier of genetic disease: Secondary | ICD-10-CM | POA: Diagnosis not present

## 2020-04-16 ENCOUNTER — Encounter: Payer: Self-pay | Admitting: Genetic Counselor

## 2020-04-16 DIAGNOSIS — Z8481 Family history of carrier of genetic disease: Secondary | ICD-10-CM | POA: Insufficient documentation

## 2020-04-16 DIAGNOSIS — Z8051 Family history of malignant neoplasm of kidney: Secondary | ICD-10-CM | POA: Insufficient documentation

## 2020-04-16 DIAGNOSIS — Z800 Family history of malignant neoplasm of digestive organs: Secondary | ICD-10-CM | POA: Insufficient documentation

## 2020-04-16 NOTE — Progress Notes (Signed)
REFERRING PROVIDER: Janyth Contes, MD [address],  [place] [address]  PRIMARY PROVIDER:  Patient, No Pcp Per  PRIMARY REASON FOR VISIT:  1. Family history of gene mutation   2. Family history of colon cancer   3. Family history of kidney cancer   4. Family history of throat cancer       HISTORY OF PRESENT ILLNESS:   [name], a 34 y.o. female, was seen for a [facility] cancer genetics consultation at the request of Dr. Sandford Craze due to a family history of a known RAD51D gene mutation and cancer.  [name] presents to clinic today to discuss the possibility of a hereditary predisposition to cancer, genetic testing, and to further clarify her future cancer risks, as well as potential cancer risks for family members.   [name] does not have a personal history of cancer.    RISK FACTORS:  Menarche was at age 66.  First live birth at age 46.  OCP use for approximately 1-2 years.  Ovaries intact: yes.  Hysterectomy: no.  Menopausal status: premenopausal.  HRT use: 0 years. Colonoscopy: no; not examined. Mammogram within the last year: n/a. Number of breast biopsies: 0. Any excessive radiation exposure in the past: no   Past Medical History:  Diagnosis Date   ADD (attention deficit disorder)    Family history of colon cancer    Family history of gene mutation    RAD51D - paternal side   Family history of kidney cancer    Family history of throat cancer    PONV (postoperative nausea and vomiting)    PPH (postpartum hemorrhage) 06/10/2017   S/P cesarean section 06/10/2017   Sepsis ([place]) 2011   hx of  due to untreated bronchitis that was misdiagnosed   Status post repeat low transverse cesarean section 10/24/2019   Vaginal Pap smear, abnormal     Past Surgical History:  Procedure Laterality Date   CESAREAN SECTION     8 years ago   CESAREAN SECTION N/A 06/10/2017   Procedure: REPEAT CESAREAN SECTION;  Surgeon:  Janyth Contes, MD;  Location: [facility];  Service: Obstetrics;  Laterality: N/A;  Nira Conn K RNFA   CESAREAN SECTION N/A 10/23/2019   Procedure: CESAREAN SECTION;  Surgeon: Janyth Contes, MD;  Location: [facility];  Service: Obstetrics;  Laterality: N/A;   CHOLECYSTECTOMY     DILATION AND CURETTAGE OF UTERUS N/A 06/10/2017   Procedure: DILATATION AND CURETTAGE;  Surgeon: Janyth Contes, MD;  Location: [facility];  Service: Gynecology;  Laterality: N/A;   IRRIGATION AND DEBRIDEMENT ABSCESS N/A 02/19/2016   Procedure: IRRIGATION AND DEBRIDEMENT LEFT LABIAL ABSCESS;  Surgeon: Coralie Keens, MD;  [facility];  Service: General;  Laterality: N/A;   TONSILLECTOMY      Social History   Socioeconomic History   Marital status: Significant Other    Spouse name: Not on file   Number of children: Not on file   Years of education: Not on file   Highest education level: Not on file  Occupational History   Not on file  Tobacco Use   Smoking status: Former Smoker    Packs/day: 0.50    Years: 15.00    Pack years: 7.50    Types: Cigarettes    Quit date: 09/28/2016    Years since quitting: 3.5   Smokeless tobacco: Never Used  Substance and Sexual Activity   Alcohol use: Yes    Comment: social drinker  Drug use: No   Sexual activity: Yes  Other Topics Concern   Not on file  Social History Narrative   Not on file   Social Determinants of Health   Financial Resource Strain:    Difficulty of Paying Living Expenses: Not on file  Food Insecurity:    Worried About [place] in the Last Year: Not on file   Ran Out of Food in the Last Year: Not on file  Transportation Needs:    Lack of Transportation (Medical): Not on file   Lack of Transportation (Non-Medical): Not on file  Physical Activity:    Days of Exercise per Week: Not on file   Minutes of Exercise per Session: Not on file  Stress:    Feeling of Stress :  Not on file  Social Connections:    Frequency of Communication with Friends and Family: Not on file   Frequency of Social Gatherings with Friends and Family: Not on file   Attends Religious Services: Not on file   Active Member of Clubs or Organizations: Not on file   Attends Archivist Meetings: Not on file   Marital Status: Not on file     FAMILY HISTORY:  We obtained a detailed, 4-generation family history.  Significant diagnoses are listed below: Family History  Problem Relation Age of Onset   Kidney disease Mother    Cancer Paternal Aunt        ovarian?   Other Paternal Aunt        RAD51D gene mutation   Throat cancer Paternal Uncle        dx 50s/60s   Asthma Maternal Grandmother    COPD Maternal Grandmother    Diabetes Maternal Grandmother    Hypertension Maternal Grandmother    Macular degeneration Maternal Grandmother    Heart attack Maternal Grandfather    Congestive Heart Failure Maternal Grandfather    Coronary artery disease Maternal Grandfather    Hypertension Maternal Grandfather    Alcoholism Maternal Grandfather    Colon cancer Paternal Grandmother        dx in her 19s   Cancer Paternal Grandmother 47       ovarian?   Other Paternal Grandmother        RAD51D gene mutation   Kidney cancer Paternal Grandfather    Cancer Maternal Great-grandmother        unknown type, dx >50   Cancer Cousin 71       ovarian? (paternal first cousin)   Other Cousin        RAD51D gene mutation (paternal first cousin)   [name] has two daughters (ages 34 months and 3 years) and one son (age 29 years). She has one brother who has one son. None of these relatives have had cancer.  [name] mother is 50 and has not had cancer. Her mother had one full-sister, one maternal half-brother, and two maternal half-sisters. [name] maternal grandmother died at the age of 22, and her maternal grandfather died at the age of 39 from a heart attack.  Her maternal grandmother's mother had cancer diagnosed older than 20, although [name] is not sure what type of cancer this was. There are no other known diagnoses of cancer on the maternal side of the family.  [name] father is 1 and has not had cancer. She has one paternal aunt and two paternal uncles. Her aunt is in her early 29s and has cancer ([name] thinks either  ovarian or cervical cancer) and has tested positive for a RAD51D gene mutation. This aunt's daughter was recently diagnosed with cancer (either ovarian or cervical) and also tested positive for the RAD51D gene mutation. One of [name]'s paternal uncles had throat cancer diagnosed older than 28. Her paternal grandmother had ovarian cancer diagnosed around 59 and died from colon cancer in her 9s, and reportedly also had the RAD51D mutation. [name] paternal grandfather died in his late 80s and had kidney cancer.   [name] is aware of previous family history of genetic testing for hereditary cancer risks. Patient's ancestors are of unknown descent. There is no reported Ashkenazi Jewish ancestry. There is no known consanguinity.  GENETIC COUNSELING ASSESSMENT: [name] is a 34 y.o. female with a paternal family history of a known RAD51D mutation and cancer. We, therefore, discussed and recommended the following at today's visit.   DISCUSSION:  Two of [name]'s paternal family members (her paternal aunt and paternal first cousin) had genetic testing that was positive for a mutation in the RAD51D gene. These results were not available for review at today's appointment. Based on her paternal relative's genetic test results, and because we don't know whether her father is a carrier of the RAD51D mutation, [name] has a 25% (1 in 4) chance to also have the RAD51D mutation.   We reviewed the cancer risks that are associated with RAD51D mutations; namely, an increased risk of ovarian cancer. Individuals with a deleterious RAD51D  gene mutation have approximately a 7% to 14% lifetime risk of ovarian cancer. These individuals may opt for risk-reducing surgery or increased cancer screening for associated cancer risks per the NCCN guidelines. We discussed that testing is beneficial for several reasons, including knowing about other cancer risks, identifying potential screening and risk-reduction options that may be appropriate, and to understand if other family members could be at risk for cancer and allow them to undergo genetic testing.  We reviewed the characteristics, features and inheritance patterns of hereditary cancer syndromes. We also discussed genetic testing, including the appropriate family members to test, the process of testing, insurance coverage and turn-around-time for results. We discussed the implications of a negative, positive and/or variant of uncertain significant result. We recommended [name] pursue genetic testing for the [facility] Multi-Cancer + RNA panel.   The Multi-Cancer + RNA Panel offered by [facility] includes sequencing and/or deletion/duplication analysis of the following 84 genes:  AIP*, ALK, APC*, ATM*, AXIN2*, BAP1*, BARD1*, BLM*, BMPR1A*, BRCA1*, BRCA2*, BRIP1*, CASR, CDC73*, CDH1*, CDK4, CDKN1B*, CDKN1C*, CDKN2A, CEBPA, CHEK2*, CTNNA1*, DICER1*, DIS3L2*, EGFR, EPCAM, FH*, FLCN*, GATA2*, GPC3, GREM1, HOXB13, HRAS, KIT, MAX*, MEN1*, MET, MITF, MLH1*, MSH2*, MSH3*, MSH6*, MUTYH*, NBN*, NF1*, NF2*, NTHL1*, PALB2*, PDGFRA, PHOX2B, PMS2*, POLD1*, POLE*, POT1*, PRKAR1A*, PTCH1*, PTEN*, RAD50*, RAD51C*, RAD51D*, RB1*, RECQL4, RET, RUNX1*, SDHA*, SDHAF2*, SDHB*, SDHC*, SDHD*, SMAD4*, SMARCA4*, SMARCB1*, SMARCE1*, STK11*, SUFU*, TERC, TERT, TMEM127*, Tp53*, TSC1*, TSC2*, VHL*, WRN*, and WT1.  RNA analysis is performed for * genes.   Based on [name]'s family history of a known RAD51D mutation and cancer, she meets medical criteria for genetic testing. Despite that she meets criteria, there may still be an out  of pocket cost. We discussed that if her out of pocket cost for testing is over $100, the laboratory will reach out to let her know. If the out of pocket cost of testing is less than $100 she will be billed by the genetic testing laboratory.   We discussed that some people do  not want to undergo genetic testing due to fear of genetic discrimination.  A federal law called the Genetic Information Non-Discrimination Act (GINA) of 2008 helps protect individuals against genetic discrimination based on their genetic test results.  It impacts both health insurance and employment.  With health insurance, it protects against increased premiums, being kicked off insurance or being forced to take a test in order to be insured.  For employment it protects against hiring, firing and promoting decisions based on genetic test results.  Health status due to a cancer diagnosis is not protected under GINA.  Additionally, life, disability, and long-term care insurance is not protected under GINA.   PLAN: After considering the risks, benefits, and limitations, [name] provided informed consent to pursue genetic testing and the blood sample was sent to [facility] for analysis of the Multi-Cancer + RNA panel. Results should be available within approximately two-three weeks' time, at which point they will be disclosed by telephone to [name], as will any additional recommendations warranted by these results. [name] will receive a summary of her genetic counseling visit and a copy of her results once available. This information will also be available in Epic.   [name]'s questions were answered to her satisfaction today. Our contact information was provided should additional questions or concerns arise. Thank you for the referral and allowing [place] to share in the care of your patient.   Clint Guy, [place], [facility] Licensed, Certified Dispensing [email or web address] Phone: [phone]  The patient  was seen for a total of 40 minutes in face-to-face genetic counseling.  This patient was discussed with Drs. Magrinat, Lindi Adie and/or Burr Medico who agrees with the above.    _______________________________________________________________________ For Office Staff:  Number of people involved in session: 1 Was an Intern/ student involved with case: no

## 2020-04-17 ENCOUNTER — Telehealth: Payer: Self-pay | Admitting: Emergency Medicine

## 2020-04-17 LAB — URINE CULTURE
Culture: >=100000 — AB
Special Requests: NORMAL

## 2020-04-17 MED ORDER — CEPHALEXIN 500 MG PO CAPS: 500.0000 mg | ORAL_CAPSULE | Freq: Three times a day (TID) | ORAL | 0 refills | Status: AC

## 2020-04-17 NOTE — Telephone Encounter (Signed)
Patient still complaining of dysuria.  Keflex sent into pharmacy

## 2020-04-18 ENCOUNTER — Telehealth (HOSPITAL_COMMUNITY): Payer: Self-pay

## 2020-04-18 MED ORDER — NITROFURANTOIN MONOHYD MACRO 100 MG PO CAPS
100.0000 mg | ORAL_CAPSULE | Freq: Two times a day (BID) | ORAL | 0 refills | Status: DC
Start: 2020-04-18 — End: 2020-05-05

## 2020-05-05 ENCOUNTER — Ambulatory Visit (INDEPENDENT_AMBULATORY_CARE_PROVIDER_SITE_OTHER): Payer: Medicaid Other | Admitting: Urology

## 2020-05-05 ENCOUNTER — Telehealth: Payer: Self-pay | Admitting: Genetic Counselor

## 2020-05-05 ENCOUNTER — Other Ambulatory Visit: Payer: Self-pay

## 2020-05-05 ENCOUNTER — Encounter: Payer: Self-pay | Admitting: Urology

## 2020-05-05 VITALS — BP 105/70 | HR 82 | Temp 98.5°F | Ht 62.0 in | Wt 153.0 lb

## 2020-05-05 DIAGNOSIS — N390 Urinary tract infection, site not specified: Secondary | ICD-10-CM

## 2020-05-05 LAB — URINALYSIS, ROUTINE W REFLEX MICROSCOPIC
Bilirubin, UA: NEGATIVE
Glucose, UA: NEGATIVE
Ketones, UA: NEGATIVE
Leukocytes,UA: NEGATIVE
Nitrite, UA: NEGATIVE
Protein,UA: NEGATIVE
Specific Gravity, UA: 1.025 (ref 1.005–1.030)
Urobilinogen, Ur: 0.2 mg/dL (ref 0.2–1.0)
pH, UA: 7 (ref 5.0–7.5)

## 2020-05-05 LAB — MICROSCOPIC EXAMINATION: Renal Epithel, UA: NONE SEEN /hpf

## 2020-05-05 NOTE — Patient Instructions (Signed)
Urinary Tract Infection, Adult A urinary tract infection (UTI) is an infection of any part of the urinary tract. The urinary tract includes:  The kidneys.  The ureters.  The bladder.  The urethra. These organs make, store, and get rid of pee (urine) in the body. What are the causes? This is caused by germs (bacteria) in your genital area. These germs grow and cause swelling (inflammation) of your urinary tract. What increases the risk? You are more likely to develop this condition if:  You have a small, thin tube (catheter) to drain pee.  You cannot control when you pee or poop (incontinence).  You are female, and: ? You use these methods to prevent pregnancy:  A medicine that kills sperm (spermicide).  A device that blocks sperm (diaphragm). ? You have low levels of a female hormone (estrogen). ? You are pregnant.  You have genes that add to your risk.  You are sexually active.  You take antibiotic medicines.  You have trouble peeing because of: ? A prostate that is bigger than normal, if you are female. ? A blockage in the part of your body that drains pee from the bladder (urethra). ? A kidney stone. ? A nerve condition that affects your bladder (neurogenic bladder). ? Not getting enough to drink. ? Not peeing often enough.  You have other conditions, such as: ? Diabetes. ? A weak disease-fighting system (immune system). ? Sickle cell disease. ? Gout. ? Injury of the spine. What are the signs or symptoms? Symptoms of this condition include:  Needing to pee right away (urgently).  Peeing often.  Peeing small amounts often.  Pain or burning when peeing.  Blood in the pee.  Pee that smells bad or not like normal.  Trouble peeing.  Pee that is cloudy.  Fluid coming from the vagina, if you are female.  Pain in the belly or lower back. Other symptoms include:  Throwing up (vomiting).  No urge to eat.  Feeling mixed up (confused).  Being tired  and grouchy (irritable).  A fever.  Watery poop (diarrhea). How is this treated? This condition may be treated with:  Antibiotic medicine.  Other medicines.  Drinking enough water. Follow these instructions at home:  Medicines  Take over-the-counter and prescription medicines only as told by your doctor.  If you were prescribed an antibiotic medicine, take it as told by your doctor. Do not stop taking it even if you start to feel better. General instructions  Make sure you: ? Pee until your bladder is empty. ? Do not hold pee for a long time. ? Empty your bladder after sex. ? Wipe from front to back after pooping if you are a female. Use each tissue one time when you wipe.  Drink enough fluid to keep your pee pale yellow.  Keep all follow-up visits as told by your doctor. This is important. Contact a doctor if:  You do not get better after 1-2 days.  Your symptoms go away and then come back. Get help right away if:  You have very bad back pain.  You have very bad pain in your lower belly.  You have a fever.  You are sick to your stomach (nauseous).  You are throwing up. Summary  A urinary tract infection (UTI) is an infection of any part of the urinary tract.  This condition is caused by germs in your genital area.  There are many risk factors for a UTI. These include having a small, thin   tube to drain pee and not being able to control when you pee or poop.  Treatment includes antibiotic medicines for germs.  Drink enough fluid to keep your pee pale yellow. This information is not intended to replace advice given to you by your health care provider. Make sure you discuss any questions you have with your health care provider. Document Revised: 06/01/2018 Document Reviewed: 12/22/2017 Elsevier Patient Education  2020 Elsevier Inc.  

## 2020-05-05 NOTE — Progress Notes (Signed)
05/05/2020 2:50 PM   Laurie Warren 1986-02-01 [id number]  Referring provider: Janyth Contes, MD [address],  [place] [address]  Recurrent UTI  HPI: [name] is a 34yo here for evaluation of recurrent UTI. Since her last child was born she has had 3 documented UTIs. She had a c section.  No prior UTIs. UTIs not related to intercourse.  She has had gross hematuria during her menstrual cycles. No hx of nephrolithiasis. No exposure risks. She has new urinary frequency and urgency. No dysuria.   PMH: Past Medical History:  Diagnosis Date  . ADD (attention deficit disorder)   . Family history of colon cancer   . Family history of gene mutation    RAD51D - paternal side  . Family history of kidney cancer   . Family history of throat cancer   . PONV (postoperative nausea and vomiting)   . PPH (postpartum hemorrhage) 06/10/2017  . S/P cesarean section 06/10/2017  . Sepsis ([facility]) 2011   hx of  due to untreated bronchitis that was misdiagnosed  . Status post repeat low transverse cesarean section 10/24/2019  . Vaginal Pap smear, abnormal     Surgical History: Past Surgical History:  Procedure Laterality Date  . CESAREAN SECTION     8 years ago  . CESAREAN SECTION N/A 06/10/2017   Procedure: REPEAT CESAREAN SECTION;  Surgeon: Janyth Contes, MD;  Location: [facility];  Service: Obstetrics;  Laterality: N/A;  [place]iba K RNFA  . CESAREAN SECTION N/A 10/23/2019   Procedure: CESAREAN SECTION;  Surgeon: Janyth Contes, MD;  Location: [facility];  Service: Obstetrics;  Laterality: N/A;  . CHOLECYSTECTOMY    . DILATION AND CURETTAGE OF UTERUS N/A 06/10/2017   Procedure: DILATATION AND CURETTAGE;  Surgeon: Janyth Contes, MD;  Location: [facility];  Service: Gynecology;  Laterality: N/A;  . IRRIGATION AND DEBRIDEMENT ABSCESS N/A 02/19/2016   Procedure: IRRIGATION AND DEBRIDEMENT LEFT LABIAL ABSCESS;  Surgeon: Coralie Keens,  MD;  [facility]: [facility];  Service: General;  Laterality: N/A;  . TONSILLECTOMY      Home Medications:  Allergies as of 05/05/2020      Reactions   Sulfa Antibiotics Anaphylaxis   Latex Swelling, Other (See Comments)   Redness.    Lidocaine Palpitations      Medication List       Accurate as of May 05, 2020  2:50 PM. If you have any questions, ask your nurse or doctor.        STOP taking these medications   nitrofurantoin (macrocrystal-monohydrate) 100 MG capsule Commonly known as: MACROBID Stopped by: Nicolette Bang, MD   ondansetron 4 MG disintegrating tablet Commonly known as: ZOFRAN-ODT Stopped by: Nicolette Bang, MD   phenazopyridine 100 MG tablet Commonly known as: Pyridium Stopped by: Nicolette Bang, MD   PRENATAL PO Stopped by: Nicolette Bang, MD   promethazine-dextromethorphan 6.25-15 MG/5ML syrup Commonly known as: PROMETHAZINE-DM Stopped by: Nicolette Bang, MD     TAKE these medications   AZO-CRANBERRY PO Take by mouth.   fluticasone 50 MCG/ACT nasal spray Commonly known as: FLONASE Place into both nostrils daily.   ibuprofen 800 MG tablet Commonly known as: ADVIL Take 1 tablet (800 mg total) by mouth every 8 (eight) hours as needed for cramping.   multivitamin tablet Take 1 tablet by mouth daily.       Allergies:  Allergies  Allergen Reactions  . Sulfa Antibiotics Anaphylaxis  . Latex Swelling and Other (See Comments)  Redness.   . Lidocaine Palpitations    Family History: Family History  Problem Relation Age of Onset  . Kidney disease Mother   . Cancer Paternal Aunt        ovarian?  . Other Paternal Aunt        RAD51D gene mutation  . Throat cancer Paternal Uncle        dx 50s/60s  . Asthma Maternal Grandmother   . COPD Maternal Grandmother   . Diabetes Maternal Grandmother   . Hypertension Maternal Grandmother   . Macular degeneration Maternal Grandmother   . Heart attack Maternal Grandfather   . Congestive  Heart Failure Maternal Grandfather   . Coronary artery disease Maternal Grandfather   . Hypertension Maternal Grandfather   . Alcoholism Maternal Grandfather   . Colon cancer Paternal Grandmother        dx in her 36s  . Cancer Paternal Grandmother 55       ovarian?  . Other Paternal Grandmother        RAD51D gene mutation  . Kidney cancer Paternal Grandfather   . Cancer Maternal Great-grandmother        unknown type, dx >50  . Cancer Cousin 40       ovarian? (paternal first cousin)  . Other Cousin        RAD51D gene mutation (paternal first cousin)    Social History:  reports that she has been smoking cigarettes. She has a 7.50 pack-year smoking history. She has never used smokeless tobacco. She reports current alcohol use. She reports that she does not use drugs.  ROS: All other review of systems were reviewed and are negative except what is noted above in HPI  Physical Exam: BP 105/70   Pulse 82   Temp 98.5 F (36.9 C)   Ht 5' 2"  (1.575 m)   Wt 153 lb (69.4 kg)   BMI 27.98 kg/m   Constitutional:  Alert and oriented, No acute distress. HEENT: [place] AT, moist mucus membranes.  Trachea midline, no masses. Cardiovascular: No clubbing, cyanosis, or edema. Respiratory: Normal respiratory effort, no increased work of breathing. GI: Abdomen is soft, nontender, nondistended, no abdominal masses GU: No CVA tenderness.  Lymph: No cervical or inguinal lymphadenopathy. Skin: No rashes, bruises or suspicious lesions. Neurologic: Grossly intact, no focal deficits, moving all 4 extremities. Psychiatric: Normal mood and affect.  Laboratory Data: Lab Results  Component Value Date   WBC 11.8 (H) 10/25/2019   HGB 10.4 (L) 10/25/2019   HCT 31.6 (L) 10/25/2019   MCV 94.9 10/25/2019   PLT 136 (L) 10/25/2019    Lab Results  Component Value Date   CREATININE 0.65 02/20/2016    No results found for: PSA  No results found for: TESTOSTERONE  No results found for:  HGBA1C  Urinalysis    Component Value Date/Time   COLORURINE YELLOW 11/05/2017 1119   APPEARANCEUR HAZY (A) 11/05/2017 1119   LABSPEC 1.020 11/05/2017 1119   PHURINE 5.0 11/05/2017 1119   GLUCOSEU NEGATIVE 11/05/2017 1119   HGBUR LARGE (A) 11/05/2017 1119   BILIRUBINUR negative 04/14/2020 1330   KETONESUR negative 04/14/2020 1330   KETONESUR 5 (A) 11/05/2017 1119   PROTEINUR trace (A) 04/14/2020 1330   PROTEINUR NEGATIVE 11/05/2017 1119   UROBILINOGEN 0.2 04/14/2020 1330   NITRITE Negative 04/14/2020 1330   NITRITE NEGATIVE 11/05/2017 1119   LEUKOCYTESUR Trace (A) 04/14/2020 1330    Lab Results  Component Value Date   BACTERIA RARE (A) 11/05/2017  Pertinent Imaging:  No results found for this or any previous visit.  No results found for this or any previous visit.  No results found for this or any previous visit.  No results found for this or any previous visit.  No results found for this or any previous visit.  No results found for this or any previous visit.  No results found for this or any previous visit.  No results found for this or any previous visit.   Assessment & Plan:    1. Recurrent UTI -We will obtain a CT due to hx of recurrent UTI after C section. I will see her back after her CT for cystoscopy - Urinalysis, Routine w reflex microscopic   No follow-ups on file.  Nicolette Bang, MD  [facility] [place]

## 2020-05-05 NOTE — Progress Notes (Signed)
Urological Symptom Review  Patient is experiencing the following symptoms: Burning/pain with urination Get up at night to urinate Urinary tract infection   Review of Systems  Gastrointestinal (upper)  : Negative for upper GI symptoms  Gastrointestinal (lower) : Diarrhea  Constitutional : Negative for symptoms  Skin: Negative for skin symptoms  Eyes: Negative for eye symptoms  Ear/Nose/Throat : Negative for Ear/Nose/Throat symptoms  Hematologic/Lymphatic: Negative for Hematologic/Lymphatic symptoms  Cardiovascular : Negative for cardiovascular symptoms  Respiratory : Negative for respiratory symptoms  Endocrine: Negative for endocrine symptoms  Musculoskeletal: Negative for musculoskeletal symptoms  Neurological: Negative for neurological symptoms  Psychologic: Negative for psychiatric symptoms

## 2020-05-05 NOTE — Telephone Encounter (Signed)
Ms. Glenard Haring called to inquire about the status of her genetic test results. According to the genetic testing laboratory, her results are expected to return later than their standard turnaround time. The current estimated report date is 05/12/2020. We will keep an eye on her test and let her know if there is any additional delay. Otherwise, we will call her once we have the results.

## 2020-05-06 ENCOUNTER — Telehealth: Payer: Self-pay | Admitting: Genetic Counselor

## 2020-05-06 ENCOUNTER — Ambulatory Visit: Payer: Self-pay | Admitting: Genetic Counselor

## 2020-05-06 DIAGNOSIS — Z1379 Encounter for other screening for genetic and chromosomal anomalies: Secondary | ICD-10-CM

## 2020-05-06 LAB — BASIC METABOLIC PANEL
BUN/Creatinine Ratio: 18 (ref 9–23)
BUN: 12 mg/dL (ref 6–20)
CO2: 25 mmol/L (ref 20–29)
Calcium: 9.5 mg/dL (ref 8.7–10.2)
Chloride: 101 mmol/L (ref 96–106)
Creatinine, Ser: 0.66 mg/dL (ref 0.57–1.00)
GFR calc Af Amer: 133 mL/min/{1.73_m2} (ref >59)
GFR calc non Af Amer: 116 mL/min/{1.73_m2} (ref >59)
Glucose: 99 mg/dL (ref 65–99)
Potassium: 4 mmol/L (ref 3.5–5.2)
Sodium: 139 mmol/L (ref 134–144)

## 2020-05-06 NOTE — Telephone Encounter (Signed)
Revealed negative genetic testing. [name] did not inherit the RAD51D gene mutation that was discovered in her family. Given this negative result, her risk to develop a RAD51D-related cancer is expected to be the same as it is in the general population.

## 2020-05-07 ENCOUNTER — Encounter: Payer: Self-pay | Admitting: Genetic Counselor

## 2020-05-07 DIAGNOSIS — Z1379 Encounter for other screening for genetic and chromosomal anomalies: Secondary | ICD-10-CM | POA: Insufficient documentation

## 2020-05-07 LAB — URINE CULTURE

## 2020-05-07 NOTE — Progress Notes (Signed)
HPI:  [name] was previously seen in the [facility] due to a family history of a known RAD51D mutation and concerns regarding a hereditary predisposition to cancer. Please refer to our prior cancer genetics clinic note for more information regarding our discussion, assessment and recommendations, at the time. [name] recent genetic test results were disclosed to her, as were recommendations warranted by these results. These results and recommendations are discussed in more detail below.  FAMILY HISTORY:  We obtained a detailed, 4-generation family history.  Significant diagnoses are listed below: Family History  Problem Relation Age of Onset  . Kidney disease Mother   . Cancer Paternal Aunt        ovarian?  . Other Paternal Aunt        RAD51D gene mutation  . Throat cancer Paternal Uncle        dx 50s/60s  . Asthma Maternal Grandmother   . COPD Maternal Grandmother   . Diabetes Maternal Grandmother   . Hypertension Maternal Grandmother   . Macular degeneration Maternal Grandmother   . Heart attack Maternal Grandfather   . Congestive Heart Failure Maternal Grandfather   . Coronary artery disease Maternal Grandfather   . Hypertension Maternal Grandfather   . Alcoholism Maternal Grandfather   . Colon cancer Paternal Grandmother        dx in her 37s  . Cancer Paternal Grandmother 76       ovarian?  . Other Paternal Grandmother        RAD51D gene mutation  . Kidney cancer Paternal Grandfather   . Cancer Maternal Great-grandmother        unknown type, dx >50  . Cancer Cousin 40       ovarian? (paternal first cousin)  . Other Cousin        RAD51D gene mutation (paternal first cousin)   [name] has two daughters (ages 65 months and 3 years) and one son (age 68 years). She has one brother who has one son. None of these relatives have had cancer.  [name] mother is 38 and has not had cancer. Her mother had one full-sister, one maternal half-brother,  and two maternal half-sisters. [name] maternal grandmother died at the age of 97, and her maternal grandfather died at the age of 57 from a heart attack. Her maternal grandmother's mother had cancer diagnosed older than 33, although [name] is not sure what type of cancer this was. There are no other known diagnoses of cancer on the maternal side of the family.  [name] father is 32 and has not had cancer. She has one paternal aunt and two paternal uncles. Her aunt is in her early 60s and has cancer ([name] thinks either ovarian or cervical cancer) and has tested positive for a RAD51D gene mutation. This aunt's daughter was recently diagnosed with cancer (either ovarian or cervical) and also tested positive for the RAD51D gene mutation. One of Laurie Warren's paternal uncles had throat cancer diagnosed older than 67. Her paternal grandmother had ovarian cancer diagnosed around 73 and died from colon cancer in her 66s, and reportedly also had the RAD51D mutation. [name] paternal grandfather died in his late 80s and had kidney cancer.   [name] is aware of previous family history of genetic testing for hereditary cancer risks. Patient's ancestors are of unknown descent. There is no reported Ashkenazi Jewish ancestry. There is no known consanguinity.  GENETIC TEST RESULTS: Genetic testing reported out  on 05/05/2020 through the [facility] Multi-Cancer + RNA panel. No pathogenic variants were detected.   The Multi-Cancer + RNA Panel offered by [facility] includes sequencing and/or deletion/duplication analysis of the following 84 genes:  AIP*, ALK, APC*, ATM*, AXIN2*, BAP1*, BARD1*, BLM*, BMPR1A*, BRCA1*, BRCA2*, BRIP1*, CASR, CDC73*, CDH1*, CDK4, CDKN1B*, CDKN1C*, CDKN2A, CEBPA, CHEK2*, CTNNA1*, DICER1*, DIS3L2*, EGFR, EPCAM, FH*, FLCN*, GATA2*, GPC3, GREM1, HOXB13, HRAS, KIT, MAX*, MEN1*, MET, MITF, MLH1*, MSH2*, MSH3*, MSH6*, MUTYH*, NBN*, NF1*, NF2*, NTHL1*, PALB2*, PDGFRA, PHOX2B, PMS2*, POLD1*,  POLE*, POT1*, PRKAR1A*, PTCH1*, PTEN*, RAD50*, RAD51C*, RAD51D*, RB1*, RECQL4, RET, RUNX1*, SDHA*, SDHAF2*, SDHB*, SDHC*, SDHD*, SMAD4*, SMARCA4*, SMARCB1*, SMARCE1*, STK11*, SUFU*, TERC, TERT, TMEM127*, Tp53*, TSC1*, TSC2*, VHL*, WRN*, and WT1.  RNA analysis is performed for * genes. The test report will be scanned into EPIC and located under the Molecular Pathology section of the Results Review tab.  A portion of the result report is included below for reference.     We recommended [name] pursue testing for the familial mutation in the RAD51D gene. [name] test was normal and did not reveal the a mutation in the RAD51D gene. We call this result a true negative result because the cancer-causing mutation was identified in Laurie Warren's family, and she did not inherit it.  Given this negative result, Laurie Warren's chances of developing RAD51D-related cancers are expected to be the same as they are in the general population.    ADDITIONAL GENETIC TESTING: We discussed with [name] that her genetic testing was fairly extensive.  If there are genes identified to increase cancer risk that can be analyzed in the future, we would be happy to discuss and coordinate this testing at that time.    CANCER SCREENING RECOMMENDATIONS: [name] test result is considered negative (normal). While reassuring, this does not definitively rule out a hereditary predisposition to cancer. It is still possible that there could be genetic mutations that are undetectable by current technology. There could be genetic mutations in genes that have not been tested or identified to increase cancer risk.  Therefore, it is recommended she continue to follow the cancer management and screening guidelines provided by herprimary healthcare provider.   An individual's cancer risk and medical management are not determined by genetic test results alone. Overall cancer risk assessment incorporates additional factors, including personal  medical history, family history, and any available genetic information that may result in a personalized plan for cancer prevention and surveillance.  Based on Laurie Warren's personal and family history, as well as her genetic test results, a statistical model Midwife) was used to estimate her risk of developing breast cancer. Tyrer-Cuzick estimates her lifetime risk of developing breast cancer to be approximately 9.2%. This lifetime breast cancer risk is a preliminary estimate based on available information using one of several models endorsed by the [place] (ACS). The ACS recommends consideration of breast MRI screening as an adjunct to mammography for patients at high risk (defined as 20% or greater lifetime risk). A more detailed breast cancer risk assessment can be considered, if clinically indicated.    RECOMMENDATIONS FOR FAMILY MEMBERS:  Individuals in this family might be at some increased risk of developing cancer, over the general population risk, simply due to the family history of cancer.  We recommended women in this family have a yearly mammogram beginning at age 89, or 16 years younger than the earliest onset of cancer, an annual clinical breast exam, and perform monthly breast self-exams. Women in this family  should also have a gynecological exam as recommended by their primary provider. All family members should be referred for colonoscopy starting at age 1.  FOLLOW-UP: Lastly, we discussed with [name] that cancer genetics is a rapidly advancing field and it is possible that new genetic tests will be appropriate for her and/or her family members in the future. We encouraged her to remain in contact with cancer genetics on an annual basis so we can update her personal and family histories and let her know of advances in cancer genetics that may benefit this family.   Our contact number was provided. [name] questions were answered to her satisfaction, and she  knows she is welcome to call us at anytime with additional questions or concerns.   Clint Guy, MS, [facility] Genetic Counselor [email or web address] Phone: [phone]

## 2020-05-15 ENCOUNTER — Ambulatory Visit (HOSPITAL_COMMUNITY)
Admission: RE | Admit: 2020-05-15 | Discharge: 2020-05-15 | Disposition: A | Discharge status: Home / Self Care | Payer: Medicaid Other | Source: Ambulatory Visit | Attending: Urology | Admitting: Urology

## 2020-05-15 ENCOUNTER — Other Ambulatory Visit: Payer: Self-pay

## 2020-05-15 DIAGNOSIS — N390 Urinary tract infection, site not specified: Secondary | ICD-10-CM | POA: Diagnosis not present

## 2020-05-15 MED ORDER — IOHEXOL 300 MG/ML  SOLN
125.0000 mL | Freq: Once | INTRAMUSCULAR | Status: AC | PRN
  Administered 2020-05-15: 125 mL via INTRAVENOUS

## 2020-05-20 ENCOUNTER — Encounter (HOSPITAL_BASED_OUTPATIENT_CLINIC_OR_DEPARTMENT_OTHER): Payer: Self-pay | Admitting: Obstetrics and Gynecology

## 2020-05-20 ENCOUNTER — Other Ambulatory Visit: Payer: Self-pay

## 2020-05-20 NOTE — Progress Notes (Signed)
Spoke w/ via phone for pre-op interview--- PT Lab needs dos----  Urine prep (per anes)/  Pre-op orders pending             Lab results------ no COVID test ------ 05-27-2020 @ 1215 Arrive at ------- 0730 NPO after MN NO Solid Food.  Clear liquids from MN until--- 0630 Medications to take morning of surgery ----- NONE Diabetic medication ----- n/a Patient Special Instructions ----- n/a Pre-Op special Istructions ----- n/a Patient verbalized understanding of instructions that were given at this phone interview. Patient denies shortness of breath, chest pain, fever, cough at this phone interview.

## 2020-05-27 ENCOUNTER — Other Ambulatory Visit (HOSPITAL_COMMUNITY)
Admission: RE | Admit: 2020-05-27 | Discharge: 2020-05-27 | Disposition: A | Discharge status: Home / Self Care | Payer: Medicaid Other | Source: Ambulatory Visit | Attending: Obstetrics and Gynecology | Admitting: Obstetrics and Gynecology

## 2020-05-27 DIAGNOSIS — Z20822 Contact with and (suspected) exposure to covid-19: Secondary | ICD-10-CM | POA: Diagnosis not present

## 2020-05-27 DIAGNOSIS — Z01812 Encounter for preprocedural laboratory examination: Secondary | ICD-10-CM | POA: Insufficient documentation

## 2020-05-27 LAB — SARS CORONAVIRUS 2 (TAT 6-24 HRS): SARS Coronavirus 2: NEGATIVE

## 2020-05-28 NOTE — H&P (Signed)
Laurie Warren is an 34 y.o. female G3P3 with menorrhagia and family history of ovarian cancer with RAD 51D gene - had genetic screening and negative.  Pt also w recurrent UTIs and hematuria - negative w/u wit CT scan.  Pt for BTL by B salpingectomy as well as hysteroscopy/D&C/polypectomy endometrial ablation (Movasure).  Have d/w pt r/b/a and process of surgery as well as expectations.  Will proceed.  Also h/o GDM, GERD, LTCS x 3.    Pertinent Gynecological History: OB History: G3, P3003 LTCS x 3, Laurie Warren, Laurie Warren and Laurie Warren 7#3-9# + abn pap, LEEP - last 2020, WNL HR HPV neg + remote h/o Chl   Menstrual History: Patient's last menstrual period was 05/06/2020 (approximate).    Past Medical History:  Diagnosis Date  . ADD (attention deficit disorder)   . Family history of colon cancer   . Family history of gene mutation    RAD51D - paternal side  . Family history of kidney cancer   . Family history of malignant neoplasm of ovary   . Family history of throat cancer   . GERD (gastroesophageal reflux disease)   . History of cervical dysplasia   . History of gestational diabetes   . Menorrhagia   . PONV (postoperative nausea and vomiting)   . Wears contact lenses     Past Surgical History:  Procedure Laterality Date  . CESAREAN SECTION  03/2007  . CESAREAN SECTION N/A 06/10/2017   Procedure: REPEAT CESAREAN SECTION;  Surgeon: Janyth Contes, MD;  Location: [place];  Service: Obstetrics;  Laterality: N/A;  El Jebel K RNFA  . CESAREAN SECTION N/A 10/23/2019   Procedure: CESAREAN SECTION;  Surgeon: Janyth Contes, MD;  Location: [facility] [facility];  Service: Obstetrics;  Laterality: N/A;  . DILATION AND CURETTAGE OF UTERUS N/A 06/10/2017   Procedure: DILATATION AND CURETTAGE;  Surgeon: Janyth Contes, MD;  Location: [place];  Service: Gynecology;  Laterality: N/A;  . IRRIGATION AND DEBRIDEMENT ABSCESS N/A 02/19/2016   Procedure: IRRIGATION AND DEBRIDEMENT  LEFT LABIAL ABSCESS;  Surgeon: Coralie Keens, MD;  Location: [facility];  Service: General;  Laterality: N/A;  . LAPAROSCOPIC CHOLECYSTECTOMY  2010  . TONSILLECTOMY  23 months old  . WISDOM TOOTH EXTRACTION      Family History  Problem Relation Age of Onset  . Kidney disease Mother   . Cancer Paternal Aunt        ovarian?  . Other Paternal Aunt        RAD51D gene mutation  . Throat cancer Paternal Uncle        dx 50s/60s  . Asthma Maternal Grandmother   . COPD Maternal Grandmother   . Diabetes Maternal Grandmother   . Hypertension Maternal Grandmother   . Macular degeneration Maternal Grandmother   . Heart attack Maternal Grandfather   . Congestive Heart Failure Maternal Grandfather   . Coronary artery disease Maternal Grandfather   . Hypertension Maternal Grandfather   . Alcoholism Maternal Grandfather   . Colon cancer Paternal Grandmother        dx in her 55s  . Cancer Paternal Grandmother 62       ovarian?  . Other Paternal Grandmother        RAD51D gene mutation  . Kidney cancer Paternal Grandfather   . Cancer Maternal Great-grandmother        unknown type, dx >50  . Cancer Cousin 40       ovarian? (paternal first cousin)  . Other Cousin  RAD51D gene mutation (paternal first cousin)    Social History:  reports that she has been smoking cigarettes. She has a 7.50 pack-year smoking history. She has never used smokeless tobacco. She reports current alcohol use. She reports that she does not use drugs. engaged; [facility]  Allergies:  Allergies  Allergen Reactions  . Sulfa Antibiotics Anaphylaxis  . Latex Swelling and Other (See Comments)    Redness.   . Lidocaine Palpitations    No medications prior to admission.    Review of Systems  Constitutional: Negative.   HENT: Negative.   Respiratory: Negative.   Cardiovascular: Negative.   Gastrointestinal: Negative.   Genitourinary: Negative.   Musculoskeletal: Negative.   Skin: Negative.   Neurological:  Negative.   Psychiatric/Behavioral: Negative.     Height 5' 2"  (1.575 m), weight 67.1 kg, last menstrual period 05/06/2020, not currently breastfeeding. Physical Exam Constitutional:      Appearance: Normal appearance.  HENT:     Head: Normocephalic and atraumatic.  Cardiovascular:     Rate and Rhythm: Normal rate and regular rhythm.  Pulmonary:     Effort: Pulmonary effort is normal.     Breath sounds: Normal breath sounds.  Abdominal:     General: Bowel sounds are normal.     Palpations: Abdomen is soft.  Genitourinary:    General: Normal vulva.  Musculoskeletal:        General: Normal range of motion.     Cervical back: Normal range of motion and neck supple.  Skin:    General: Skin is warm and dry.  Neurological:     General: No focal deficit present.     Mental Status: She is alert and oriented to person, place, and time.  Psychiatric:        Mood and Affect: Mood normal.        Behavior: Behavior normal.   US reveals likely polyp   Assessment/Plan: [name] for BTL by B salpingectomy, hysteroscopy/D&C/removal of polyp/Novasure endometrial ablation  D/w pt r/b/a of surgery - will proceed COVID neg  Areil Ottey Bovard-Stuckert 05/28/2020, 5:56 PM

## 2020-05-29 ENCOUNTER — Encounter (HOSPITAL_BASED_OUTPATIENT_CLINIC_OR_DEPARTMENT_OTHER): Payer: Self-pay | Admitting: Obstetrics and Gynecology

## 2020-05-29 ENCOUNTER — Other Ambulatory Visit: Payer: Self-pay

## 2020-05-29 ENCOUNTER — Encounter (HOSPITAL_BASED_OUTPATIENT_CLINIC_OR_DEPARTMENT_OTHER)
Admission: RE | Disposition: A | Discharge status: Home / Self Care | Payer: Self-pay | Source: Home / Self Care | Attending: Obstetrics and Gynecology

## 2020-05-29 ENCOUNTER — Ambulatory Visit (HOSPITAL_BASED_OUTPATIENT_CLINIC_OR_DEPARTMENT_OTHER)
Admission: RE | Admit: 2020-05-29 | Discharge: 2020-05-29 | Disposition: A | Discharge status: Home / Self Care | Payer: Medicaid Other | Attending: Obstetrics and Gynecology | Admitting: Obstetrics and Gynecology

## 2020-05-29 ENCOUNTER — Ambulatory Visit (HOSPITAL_BASED_OUTPATIENT_CLINIC_OR_DEPARTMENT_OTHER): Payer: Medicaid Other | Admitting: Anesthesiology

## 2020-05-29 DIAGNOSIS — Z8742 Personal history of other diseases of the female genital tract: Secondary | ICD-10-CM

## 2020-05-29 DIAGNOSIS — Z9079 Acquired absence of other genital organ(s): Secondary | ICD-10-CM

## 2020-05-29 DIAGNOSIS — Z884 Allergy status to anesthetic agent status: Secondary | ICD-10-CM | POA: Diagnosis not present

## 2020-05-29 DIAGNOSIS — Z882 Allergy status to sulfonamides status: Secondary | ICD-10-CM | POA: Diagnosis not present

## 2020-05-29 DIAGNOSIS — N920 Excessive and frequent menstruation with regular cycle: Secondary | ICD-10-CM | POA: Insufficient documentation

## 2020-05-29 DIAGNOSIS — N838 Other noninflammatory disorders of ovary, fallopian tube and broad ligament: Secondary | ICD-10-CM | POA: Diagnosis not present

## 2020-05-29 DIAGNOSIS — F17210 Nicotine dependence, cigarettes, uncomplicated: Secondary | ICD-10-CM | POA: Insufficient documentation

## 2020-05-29 DIAGNOSIS — N841 Polyp of cervix uteri: Secondary | ICD-10-CM | POA: Diagnosis not present

## 2020-05-29 DIAGNOSIS — Z9889 Other specified postprocedural states: Secondary | ICD-10-CM

## 2020-05-29 DIAGNOSIS — Z8041 Family history of malignant neoplasm of ovary: Secondary | ICD-10-CM | POA: Insufficient documentation

## 2020-05-29 DIAGNOSIS — Z91040 Latex allergy status: Secondary | ICD-10-CM | POA: Insufficient documentation

## 2020-05-29 HISTORY — DX: Personal history of other diseases of the female genital tract: Z87.42

## 2020-05-29 HISTORY — DX: Other specified postprocedural states: Z98.890

## 2020-05-29 HISTORY — DX: Acquired absence of other genital organ(s): Z90.79

## 2020-05-29 HISTORY — DX: Family history of malignant neoplasm of ovary: Z80.41

## 2020-05-29 HISTORY — DX: Personal history of cervical dysplasia: Z87.410

## 2020-05-29 HISTORY — DX: Gastro-esophageal reflux disease without esophagitis: K21.9

## 2020-05-29 HISTORY — PX: DILITATION & CURRETTAGE/HYSTROSCOPY WITH NOVASURE ABLATION: SHX5568

## 2020-05-29 HISTORY — PX: LAPAROSCOPIC BILATERAL SALPINGECTOMY: SHX5889

## 2020-05-29 HISTORY — DX: Presence of spectacles and contact lenses: Z97.3

## 2020-05-29 HISTORY — DX: Personal history of gestational diabetes: Z86.32

## 2020-05-29 HISTORY — DX: Excessive and frequent menstruation with regular cycle: N92.0

## 2020-05-29 LAB — BASIC METABOLIC PANEL
Anion gap: 10 (ref 5–15)
BUN: 14 mg/dL (ref 6–20)
CO2: 21 mmol/L — ABNORMAL LOW (ref 22–32)
Calcium: 9.1 mg/dL (ref 8.9–10.3)
Chloride: 105 mmol/L (ref 98–111)
Creatinine, Ser: 0.66 mg/dL (ref 0.44–1.00)
GFR, Estimated: >60 mL/min (ref >60)
Glucose, Bld: 106 mg/dL — ABNORMAL HIGH (ref 70–99)
Potassium: 3.8 mmol/L (ref 3.5–5.1)
Sodium: 136 mmol/L (ref 135–145)

## 2020-05-29 LAB — CBC
HCT: 41.1 % (ref 36.0–46.0)
Hemoglobin: 13.8 g/dL (ref 12.0–15.0)
MCH: 31 pg (ref 26.0–34.0)
MCHC: 33.6 g/dL (ref 30.0–36.0)
MCV: 92.4 fL (ref 80.0–100.0)
Platelets: 298 10*3/uL (ref 150–400)
RBC: 4.45 MIL/uL (ref 3.87–5.11)
RDW: 12 % (ref 11.5–15.5)
WBC: 10.1 10*3/uL (ref 4.0–10.5)
nRBC: 0 % (ref 0.0–0.2)

## 2020-05-29 LAB — POCT PREGNANCY, URINE: Preg Test, Ur: NEGATIVE

## 2020-05-29 SURGERY — DILATATION & CURETTAGE/HYSTEROSCOPY WITH NOVASURE ABLATION
Anesthesia: General | Site: Vagina

## 2020-05-29 MED ORDER — BUPIVACAINE HCL (PF) 0.25 % IJ SOLN
INTRAMUSCULAR | Status: DC | PRN
  Administered 2020-05-29: 10 mL

## 2020-05-29 MED ORDER — DEXAMETHASONE SODIUM PHOSPHATE 10 MG/ML IJ SOLN
INTRAMUSCULAR | Status: AC
  Filled 2020-05-29: qty 1

## 2020-05-29 MED ORDER — IBUPROFEN 800 MG PO TABS
ORAL_TABLET | ORAL | Status: AC
  Filled 2020-05-29: qty 1

## 2020-05-29 MED ORDER — FENTANYL CITRATE (PF) 100 MCG/2ML IJ SOLN
INTRAMUSCULAR | Status: AC
  Filled 2020-05-29: qty 2

## 2020-05-29 MED ORDER — ROCURONIUM BROMIDE 10 MG/ML (PF) SYRINGE
PREFILLED_SYRINGE | INTRAVENOUS | Status: AC
  Filled 2020-05-29: qty 10

## 2020-05-29 MED ORDER — OXYCODONE HCL 5 MG PO TABS: 5.0000 mg | ORAL_TABLET | Freq: Once | ORAL | Status: DC | PRN

## 2020-05-29 MED ORDER — IBUPROFEN 800 MG PO TABS: 800.0000 mg | ORAL_TABLET | Freq: Three times a day (TID) | ORAL | 1 refills | Status: DC | PRN

## 2020-05-29 MED ORDER — PROPOFOL 10 MG/ML IV BOLUS
INTRAVENOUS | Status: DC | PRN
  Administered 2020-05-29: 150 mg via INTRAVENOUS

## 2020-05-29 MED ORDER — POVIDONE-IODINE 10 % EX SWAB: 2.0000 "application " | Freq: Once | CUTANEOUS | Status: DC

## 2020-05-29 MED ORDER — DEXAMETHASONE SODIUM PHOSPHATE 4 MG/ML IJ SOLN
INTRAMUSCULAR | Status: DC | PRN
  Administered 2020-05-29: 10 mg via INTRAVENOUS

## 2020-05-29 MED ORDER — OXYCODONE HCL 5 MG/5ML PO SOLN: 5.0000 mg | Freq: Once | ORAL | Status: DC | PRN

## 2020-05-29 MED ORDER — SODIUM CHLORIDE 0.9 % IR SOLN
Status: DC | PRN
  Administered 2020-05-29: 3000 mL

## 2020-05-29 MED ORDER — ROCURONIUM BROMIDE 10 MG/ML (PF) SYRINGE
PREFILLED_SYRINGE | INTRAVENOUS | Status: DC | PRN
  Administered 2020-05-29: 10 mg via INTRAVENOUS
  Administered 2020-05-29: 40 mg via INTRAVENOUS

## 2020-05-29 MED ORDER — LACTATED RINGERS IV SOLN: INTRAVENOUS | Status: DC

## 2020-05-29 MED ORDER — ONDANSETRON HCL 4 MG/2ML IJ SOLN
INTRAMUSCULAR | Status: AC
  Filled 2020-05-29: qty 2

## 2020-05-29 MED ORDER — PROMETHAZINE HCL 25 MG/ML IJ SOLN
INTRAMUSCULAR | Status: AC
  Filled 2020-05-29: qty 1

## 2020-05-29 MED ORDER — MIDAZOLAM HCL 5 MG/5ML IJ SOLN
INTRAMUSCULAR | Status: DC | PRN
  Administered 2020-05-29: 2 mg via INTRAVENOUS

## 2020-05-29 MED ORDER — FENTANYL CITRATE (PF) 100 MCG/2ML IJ SOLN
INTRAMUSCULAR | Status: DC | PRN
  Administered 2020-05-29: 50 ug via INTRAVENOUS

## 2020-05-29 MED ORDER — OXYCODONE HCL 5 MG PO TABS
5.0000 mg | ORAL_TABLET | Freq: Four times a day (QID) | ORAL | 0 refills | Status: DC | PRN
Start: 2020-05-29 — End: 2021-08-03

## 2020-05-29 MED ORDER — SUGAMMADEX SODIUM 200 MG/2ML IV SOLN
INTRAVENOUS | Status: DC | PRN
  Administered 2020-05-29: 200 mg via INTRAVENOUS

## 2020-05-29 MED ORDER — ONDANSETRON HCL 4 MG/2ML IJ SOLN: 4.0000 mg | Freq: Once | INTRAMUSCULAR | Status: DC | PRN

## 2020-05-29 MED ORDER — AMISULPRIDE (ANTIEMETIC) 5 MG/2ML IV SOLN
INTRAVENOUS | Status: AC
  Filled 2020-05-29: qty 4

## 2020-05-29 MED ORDER — PROMETHAZINE HCL 25 MG/ML IJ SOLN
6.2500 mg | Freq: Once | INTRAMUSCULAR | Status: AC
  Administered 2020-05-29: 6.25 mg via INTRAVENOUS

## 2020-05-29 MED ORDER — MIDAZOLAM HCL 2 MG/2ML IJ SOLN
INTRAMUSCULAR | Status: AC
  Filled 2020-05-29: qty 2

## 2020-05-29 MED ORDER — LIDOCAINE HCL 1 % IJ SOLN
INTRAMUSCULAR | Status: DC | PRN
  Administered 2020-05-29: 14 mL

## 2020-05-29 MED ORDER — LIDOCAINE HCL (PF) 2 % IJ SOLN
INTRAMUSCULAR | Status: AC
  Filled 2020-05-29: qty 5

## 2020-05-29 MED ORDER — ESMOLOL HCL 100 MG/10ML IV SOLN
INTRAVENOUS | Status: DC | PRN
  Administered 2020-05-29: 30 mg via INTRAVENOUS

## 2020-05-29 MED ORDER — IBUPROFEN 800 MG PO TABS
800.0000 mg | ORAL_TABLET | Freq: Once | ORAL | Status: AC
  Administered 2020-05-29: 800 mg via ORAL

## 2020-05-29 MED ORDER — AMISULPRIDE (ANTIEMETIC) 5 MG/2ML IV SOLN
10.0000 mg | Freq: Once | INTRAVENOUS | Status: AC | PRN
  Administered 2020-05-29: 10 mg via INTRAVENOUS

## 2020-05-29 MED ORDER — ONDANSETRON HCL 4 MG/2ML IJ SOLN
INTRAMUSCULAR | Status: DC | PRN
  Administered 2020-05-29: 4 mg via INTRAVENOUS

## 2020-05-29 MED ORDER — PHENYLEPHRINE 40 MCG/ML (10ML) SYRINGE FOR IV PUSH (FOR BLOOD PRESSURE SUPPORT)
PREFILLED_SYRINGE | INTRAVENOUS | Status: DC | PRN
  Administered 2020-05-29 (×2): 40 ug via INTRAVENOUS

## 2020-05-29 MED ORDER — FENTANYL CITRATE (PF) 100 MCG/2ML IJ SOLN
25.0000 ug | INTRAMUSCULAR | Status: DC | PRN
  Administered 2020-05-29 (×2): 25 ug via INTRAVENOUS

## 2020-05-29 MED ORDER — PROPOFOL 10 MG/ML IV BOLUS
INTRAVENOUS | Status: AC
  Filled 2020-05-29: qty 40

## 2020-05-29 SURGICAL SUPPLY — 37 items
ABLATOR SURESOUND NOVASURE (ABLATOR) ×4 IMPLANT
ADH SKN CLS APL DERMABOND .7 (GAUZE/BANDAGES/DRESSINGS) ×2
BIPOLAR CUTTING LOOP 21FR (ELECTRODE)
CATH SILICONE 16FRX5CC (CATHETERS) ×4 IMPLANT
COVER MAYO STAND STRL (DRAPES) ×4 IMPLANT
COVER WAND RF STERILE (DRAPES) ×4 IMPLANT
DERMABOND ADVANCED (GAUZE/BANDAGES/DRESSINGS) ×2
DERMABOND ADVANCED .7 DNX12 (GAUZE/BANDAGES/DRESSINGS) ×2 IMPLANT
DURAPREP 26ML APPLICATOR (WOUND CARE) ×4 IMPLANT
GAUZE 4X4 16PLY RFD (DISPOSABLE) ×4 IMPLANT
GAUZE SPONGE 4X4 12PLY STRL LF (GAUZE/BANDAGES/DRESSINGS) ×4 IMPLANT
GLOVE BIOGEL PI IND STRL 6.5 (GLOVE) ×4 IMPLANT
GLOVE BIOGEL PI INDICATOR 6.5 (GLOVE) ×4
GLOVE SURG SS PI 6.5 STRL IVOR (GLOVE) ×16 IMPLANT
GLOVE SURG UNDER POLY LF SZ7 (GLOVE) ×12 IMPLANT
GOWN STRL REUS W/TWL LRG LVL3 (GOWN DISPOSABLE) ×12 IMPLANT
IV NS IRRIG 3000ML ARTHROMATIC (IV SOLUTION) ×4 IMPLANT
KIT PROCEDURE FLUENT (KITS) ×4 IMPLANT
KIT TURNOVER CYSTO (KITS) ×4 IMPLANT
LOOP CUTTING BIPOLAR 21FR (ELECTRODE) IMPLANT
MANIFOLD NEPTUNE II (INSTRUMENTS) ×4 IMPLANT
NEEDLE INSUFFLATION 120MM (ENDOMECHANICALS) ×4 IMPLANT
NS IRRIG 500ML POUR BTL (IV SOLUTION) ×4 IMPLANT
PACK LAPAROSCOPY BASIN (CUSTOM PROCEDURE TRAY) ×4 IMPLANT
PACK VAGINAL MINOR WOMEN LF (CUSTOM PROCEDURE TRAY) ×4 IMPLANT
PAD OB MATERNITY 4.3X12.25 (PERSONAL CARE ITEMS) ×4 IMPLANT
SET TUBE SMOKE EVAC HIGH FLOW (TUBING) ×4 IMPLANT
SHEARS HARMONIC ACE PLUS 36CM (ENDOMECHANICALS) ×4 IMPLANT
SUT MNCRL AB 4-0 PS2 18 (SUTURE) ×4 IMPLANT
SUT VIC AB 4-0 PS2 18 (SUTURE) ×4 IMPLANT
SUT VICRYL 0 UR6 27IN ABS (SUTURE) ×4 IMPLANT
SUT VICRYL RAPIDE 3 0 (SUTURE) ×4 IMPLANT
TOWEL OR 17X26 10 PK STRL BLUE (TOWEL DISPOSABLE) ×4 IMPLANT
TRAY FOLEY W/BAG SLVR 14FR LF (SET/KITS/TRAYS/PACK) ×4 IMPLANT
TROCAR BLADELESS OPT 5 100 (ENDOMECHANICALS) ×8 IMPLANT
TROCAR XCEL NON-BLD 11X100MML (ENDOMECHANICALS) ×4 IMPLANT
WARMER LAPAROSCOPE (MISCELLANEOUS) ×4 IMPLANT

## 2020-05-29 NOTE — Anesthesia Procedure Notes (Signed)
Procedure Name: Intubation Date/Time: 05/29/2020 9:32 AM Performed by: Lieutenant Diego, CRNA Pre-anesthesia Checklist: Patient identified, Emergency Drugs available, Suction available and Patient being monitored Patient Re-evaluated:Patient Re-evaluated prior to induction Oxygen Delivery Method: Circle system utilized Preoxygenation: Pre-oxygenation with 100% oxygen Induction Type: IV induction Ventilation: Mask ventilation without difficulty Laryngoscope Size: Mac and 3 Grade View: Grade I Tube type: Oral Number of attempts: 1 Airway Equipment and Method: Stylet Placement Confirmation: ETT inserted through vocal cords under direct vision,  positive ETCO2 and breath sounds checked- equal and bilateral Secured at: 22 cm Tube secured with: Tape Dental Injury: Teeth and Oropharynx as per pre-operative assessment

## 2020-05-29 NOTE — Anesthesia Preprocedure Evaluation (Addendum)
Anesthesia Evaluation  Patient identified by MRN, date of birth, ID band Patient awake    Reviewed: Allergy & Precautions, NPO status , Patient's Chart, lab work & pertinent test results  History of Anesthesia Complications (+) PONVNegative for: history of anesthetic complications  Airway Mallampati: II  TM Distance: >3 FB Neck ROM: Full    Dental  (+) Teeth Intact   Pulmonary neg pulmonary ROS, Current Smoker,    Pulmonary exam normal        Cardiovascular negative cardio ROS Normal cardiovascular exam     Neuro/Psych negative neurological ROS  negative psych ROS   GI/Hepatic Neg liver ROS, GERD  ,  Endo/Other  negative endocrine ROS  Renal/GU negative Renal ROS  negative genitourinary   Musculoskeletal negative musculoskeletal ROS (+)   Abdominal   Peds  Hematology Strong family cancer history   Anesthesia Other Findings   Reproductive/Obstetrics                            Anesthesia Physical Anesthesia Plan  ASA: II  Anesthesia Plan: General   Post-op Pain Management:    Induction: Intravenous  PONV Risk Score and Plan: 4 or greater and Ondansetron, Dexamethasone, Treatment may vary due to age or medical condition and Midazolam  Airway Management Planned: Oral ETT  Additional Equipment: None  Intra-op Plan:   Post-operative Plan: Extubation in OR  Informed Consent: I have reviewed the patients History and Physical, chart, labs and discussed the procedure including the risks, benefits and alternatives for the proposed anesthesia with the patient or authorized representative who has indicated his/her understanding and acceptance.     Dental advisory given  Plan Discussed with:   Anesthesia Plan Comments:        Anesthesia Quick Evaluation

## 2020-05-29 NOTE — Brief Op Note (Signed)
05/29/2020  11:10 AM  PATIENT:  Laurie Warren  34 y.o. female  PRE-OPERATIVE DIAGNOSIS:  menorrhagia, family history of malignant ovary  POST-OPERATIVE DIAGNOSIS:  menorrhagia, family history of malignant ovary  PROCEDURE:  Procedure(s): DILATATION & CURETTAGE/HYSTEROSCOPY WITH NOVASURE ABLATION, CERVICAL LACERATION REPAIR (N/A) LAPAROSCOPIC BILATERAL SALPINGECTOMY (Bilateral)  SURGEON:  Surgeon(s) and Role:    * Bovard-Stuckert, Hollis Oh, MD - Primary  ANESTHESIA:   local and general  EBL:  10 mL  IVF and uop per anesthesia  BLOOD ADMINISTERED:none  DRAINS: none   LOCAL MEDICATIONS USED:  MARCAINE    and LIDOCAINE   SPECIMEN:  Source of Specimen:  B fallopian tubes; endometrial currettings w cervical polyp  DISPOSITION OF SPECIMEN:  PATHOLOGY  COUNTS:  YES  TOURNIQUET:  * No tourniquets in log *  DICTATION: .Other Dictation: Dictation Number [id number]  PLAN OF CARE: Discharge to home after PACU  PATIENT DISPOSITION:  PACU - hemodynamically stable.   Delay start of Pharmacological VTE agent (>24hrs) due to surgical blood loss or risk of bleeding: not applicable

## 2020-05-29 NOTE — Interval H&P Note (Signed)
History and Physical Interval Note:  05/29/2020 9:07 AM  Laurie Warren  has presented today for surgery, with the diagnosis of menorrhagia, family history of malignant ovary.  The various methods of treatment have been discussed with the patient and family. After consideration of risks, benefits and other options for treatment, the patient has consented to  Procedure(s): DILATATION & CURETTAGE/HYSTEROSCOPY WITH NOVASURE ABLATION (N/A) LAPAROSCOPIC BILATERAL SALPINGECTOMY (Bilateral) as a surgical intervention.  The patient's history has been reviewed, patient examined, no change in status, stable for surgery.  I have reviewed the patient's chart and labs.  Questions were answered to the patient's satisfaction.     Zoila Ditullio Bovard-Stuckert

## 2020-05-29 NOTE — Discharge Instructions (Signed)
°  Post Anesthesia Home Care Instructions  Activity: Get plenty of rest for the remainder of the day. A responsible individual must stay with you for 24 hours following the procedure.  For the next 24 hours, DO NOT: -Drive a car -Paediatric nurse -Drink alcoholic beverages -Take any medication unless instructed by your physician -Make any legal decisions or sign important papers.  Meals: Start with liquid foods such as gelatin or soup. Progress to regular foods as tolerated. Avoid greasy, spicy, heavy foods. If nausea and/or vomiting occur, drink only clear liquids until the nausea and/or vomiting subsides. Call your physician if vomiting continues.  Special Instructions/Symptoms: Your throat may feel dry or sore from the anesthesia or the breathing tube placed in your throat during surgery. If this causes discomfort, gargle with warm salt water. The discomfort should disappear within 24 hours.  DISCHARGE INSTRUCTIONS: Laparoscopy  The following instructions have been prepared to help you care for yourself upon your return home today.  Wound care:  Do not get the incision wet for the first 24 hours. The incision should be kept clean and dry.  The Band-Aids or dressings may be removed the day after surgery.  Should the incision become sore, red, and swollen after the first week, check with your doctor.  Personal hygiene:  Shower the day after your procedure.  Activity and limitations:  Do NOT drive or operate any equipment today.  Do NOT lift anything more than 15 pounds for 2-3 weeks after surgery.  Do NOT rest in bed all day.  Walking is encouraged. Walk each day, starting slowly with 5-minute walks 3 or 4 times a day. Slowly increase the length of your walks.  Walk up and down stairs slowly.  Do NOT do strenuous activities, such as golfing, playing tennis, bowling, running, biking, weight lifting, gardening, mowing, or vacuuming for 2-4 weeks. Ask your doctor when it is  okay to start.  Diet: Eat a light meal as desired this evening. You may resume your usual diet tomorrow.  Return to work: This is dependent on the type of work you do. For the most part you can return to a desk job within a week of surgery. If you are more active at work, please discuss this with your doctor.  What to expect after your surgery: You may have a slight burning sensation when you urinate on the first day. You may have a very small amount of blood in the urine. Expect to have a small amount of vaginal discharge/light bleeding for 1-2 weeks. It is not unusual to have abdominal soreness and bruising for up to 2 weeks. You may be tired and need more rest for about 1 week. You may experience shoulder pain for 24-72 hours. Lying flat in bed may relieve it.  Call your doctor for any of the following:  Develop a fever of 100.4 or greater  Inability to urinate 6 hours after discharge from hospital  Severe pain not relieved by pain medications  Persistent of heavy bleeding at incision site  Redness or swelling around incision site after a week  Increasing nausea or vomiting   3. Avoid touching the patch. Wash your hands with soap and water after contact with the patch.

## 2020-05-29 NOTE — Anesthesia Postprocedure Evaluation (Signed)
Anesthesia Post Note  Patient: [name]  Procedure(s) Performed: DILATATION & CURETTAGE/HYSTEROSCOPY WITH NOVASURE ABLATION, CERVICAL LACERATION REPAIR (N/A Vagina ) LAPAROSCOPIC BILATERAL SALPINGECTOMY (Bilateral Abdomen)     Patient location during evaluation: PACU Anesthesia Type: General Level of consciousness: awake and alert Pain management: pain level controlled Vital Signs Assessment: post-procedure vital signs reviewed and stable Respiratory status: spontaneous breathing, nonlabored ventilation and respiratory function stable Cardiovascular status: blood pressure returned to baseline and stable Postop Assessment: no apparent nausea or vomiting Anesthetic complications: no   No complications documented.  Last Vitals:  Vitals:   05/29/20 1159 05/29/20 1200  BP:  102/62  Pulse: 93 94  Resp: 15 (!) 32  Temp:    SpO2: 97% 97%    Last Pain:  Vitals:   05/29/20 1200  TempSrc:   PainSc: 2                  Lidia Collum

## 2020-05-29 NOTE — Transfer of Care (Signed)
Immediate Anesthesia Transfer of Care Note  Patient: Laurie Warren  Procedure(s) Performed: DILATATION & CURETTAGE/HYSTEROSCOPY WITH NOVASURE ABLATION, CERVICAL LACERATION REPAIR (N/A Vagina ) LAPAROSCOPIC BILATERAL SALPINGECTOMY (Bilateral Abdomen)  Patient Location: PACU  Anesthesia Type:General  Level of Consciousness: awake and alert   Airway & Oxygen Therapy: Patient Spontanous Breathing and Patient connected to face mask oxygen  Post-op Assessment: Report given to RN and Post -op Vital signs reviewed and stable  Post vital signs: Reviewed and stable  Last Vitals:  Vitals Value Taken Time  BP 109/67 05/29/20 1112  Temp    Pulse 98 05/29/20 1113  Resp 15 05/29/20 1113  SpO2 100 % 05/29/20 1113  Vitals shown include unvalidated device data.  Last Pain:  Vitals:   05/29/20 0818  TempSrc: Oral  PainSc: 0-No pain      Patients Stated Pain Goal: 4 (14/70/92 9574)  Complications: No complications documented.

## 2020-05-29 NOTE — Op Note (Signed)
NAMEEVAH, RASHID MEDICAL RECORD [id number] ACCOUNT [id number] DATE OF BIRTH:[birth year] FACILITY: [facility] LOCATION: [facility] PHYSICIAN:Sabriah Hobbins BOVARD-STUCKERT, MD  OPERATIVE REPORT  DATE OF PROCEDURE:  05/29/2020  PREOPERATIVE DIAGNOSES:  Menorrhagia, family history of ovarian malignancy.  POSTOPERATIVE DIAGNOSES:  Menorrhagia, family history of ovarian malignancy, cervical polyps, status post endometrial ablation and bilateral salpingectomy.  PROCEDURE:  Bilateral tubal ligation by bilateral salpingectomy laparoscopically, cervical laceration repair, hysteroscopy, D and C, cervical polypectomy and NovaSure endometrial ablation.  SURGEON:  Janyth Contes,  MD  ANESTHESIA:  Local and general.  INTRAVENOUS FLUIDS AND URINE OUTPUT:  Per anesthesia.  ESTIMATED BLOOD LOSS:  Approximately 10 mL.  COMPLICATIONS:  None.  PATHOLOGY:  Bilateral fallopian tubes, endometrial curettings with cervical polyp.  DESCRIPTION OF PROCEDURE:  After informed consent was reviewed with the patient, she was transported to the operating room and placed on the table in supine position.  General anesthesia was induced and found to be adequate.  She was then placed in the  [place] stirrups, prepped and draped in the normal sterile fashion.  After an appropriate timeout was performed, using an open-sided speculum a uterine manipulator was placed.  Gown and gloves were changed.  Attention was turned back to the abdominal  portion of the case.  An approximately 5 mm infraumbilical incision was made.  Using the Veress needle, pneumoperitoneum was obtained after passing the hanging drop test and with an opening pressure of 4 mmHg.  The trocar was placed under direct  visualization.  Accessory ports were placed on both right and left under direct visualization as well.  A brief pelvic survey revealed normal-appearing uterus, normal bilateral tubes, left with peritubal cysts.  The fimbriated end of the tube was  grasped  with an atraumatic grasper and using the Harmonic scalpel, the tube was removed to the level of the cornu and removed through the trocar.  Attention was then turned to the right, which was done in a similar fashion, the tube was removed with the  Harmonic scalpel.  This was noted to be hemostatic.  After removal attention was turned to the vaginal portion of the case.  Using an open-sided speculum, her cervix was visualized, dilated to 17, 19 to accommodate the hysteroscope.  The hysteroscopic  survey of her uterus revealed normal tubal ostia.  The uterus was sounded to 10 mm.  The cervix was sound to be 4 mm giving her a cavity length of 6 mm.    After a D and C was performed and the polyps were removed with a combination of D and C as well as  Randall stone forceps a NovaSure endometrial ablation tool was placed in entire uterus the width was found to be 4.8 cm.  The cavity check was passed using a power of 158 watts for 1 minute 6 seconds, the ablative procedure was performed.  The  instruments were removed from the uterus.  A cervical laceration had occurred where the single-tooth tenaculum was, this was repaired with a figure-of-eight of 3-0 Vicryl.  Attention was turned back to the abdominal portion of the case.  Gown and gloves  were again changed.  The tubal sites were hemostatic.  The ports were removed under direct visualization.  The 10 mm port was closed with a deep suture.  The other ports were closed with 4-0 Vicryl and Dermabond.  The patient tolerated the procedure  well.  Sponge, lap and needle counts were correct x2.  At the end of the hysteroscope, there was a 200  mL ____.  HN/NUANCE  D:05/29/2020 T:05/29/2020 JOB:[id number]

## 2020-05-30 ENCOUNTER — Encounter (HOSPITAL_BASED_OUTPATIENT_CLINIC_OR_DEPARTMENT_OTHER): Payer: Self-pay | Admitting: Obstetrics and Gynecology

## 2020-05-30 LAB — SURGICAL PATHOLOGY

## 2020-06-06 ENCOUNTER — Encounter: Payer: Self-pay | Admitting: Urology

## 2020-06-06 ENCOUNTER — Other Ambulatory Visit: Payer: Self-pay

## 2020-06-06 ENCOUNTER — Ambulatory Visit (INDEPENDENT_AMBULATORY_CARE_PROVIDER_SITE_OTHER): Payer: Medicaid Other | Admitting: Urology

## 2020-06-06 VITALS — BP 103/68 | HR 92 | Temp 98.9°F | Ht 62.0 in | Wt 151.5 lb

## 2020-06-06 DIAGNOSIS — N390 Urinary tract infection, site not specified: Secondary | ICD-10-CM

## 2020-06-06 LAB — URINALYSIS, ROUTINE W REFLEX MICROSCOPIC
Bilirubin, UA: NEGATIVE
Glucose, UA: NEGATIVE
Nitrite, UA: NEGATIVE
Specific Gravity, UA: >1.03 — ABNORMAL HIGH (ref 1.005–1.030)
Urobilinogen, Ur: 0.2 mg/dL (ref 0.2–1.0)
pH, UA: 5.5 (ref 5.0–7.5)

## 2020-06-06 LAB — MICROSCOPIC EXAMINATION
RBC, Urine: >30 /hpf — AB (ref 0–2)
Renal Epithel, UA: NONE SEEN /hpf

## 2020-06-06 MED ORDER — NITROFURANTOIN MONOHYD MACRO 100 MG PO CAPS: 100.0000 mg | ORAL_CAPSULE | Freq: Two times a day (BID) | ORAL | 0 refills | Status: DC

## 2020-06-06 MED ORDER — CIPROFLOXACIN HCL 500 MG PO TABS: 500.0000 mg | ORAL_TABLET | Freq: Once | ORAL | Status: DC

## 2020-06-06 NOTE — Patient Instructions (Signed)
Urinary Tract Infection, Adult A urinary tract infection (UTI) is an infection of any part of the urinary tract. The urinary tract includes:  The kidneys.  The ureters.  The bladder.  The urethra. These organs make, store, and get rid of pee (urine) in the body. What are the causes? This is caused by germs (bacteria) in your genital area. These germs grow and cause swelling (inflammation) of your urinary tract. What increases the risk? You are more likely to develop this condition if:  You have a small, thin tube (catheter) to drain pee.  You cannot control when you pee or poop (incontinence).  You are female, and: ? You use these methods to prevent pregnancy:  A medicine that kills sperm (spermicide).  A device that blocks sperm (diaphragm). ? You have low levels of a female hormone (estrogen). ? You are pregnant.  You have genes that add to your risk.  You are sexually active.  You take antibiotic medicines.  You have trouble peeing because of: ? A prostate that is bigger than normal, if you are female. ? A blockage in the part of your body that drains pee from the bladder (urethra). ? A kidney stone. ? A nerve condition that affects your bladder (neurogenic bladder). ? Not getting enough to drink. ? Not peeing often enough.  You have other conditions, such as: ? Diabetes. ? A weak disease-fighting system (immune system). ? Sickle cell disease. ? Gout. ? Injury of the spine. What are the signs or symptoms? Symptoms of this condition include:  Needing to pee right away (urgently).  Peeing often.  Peeing small amounts often.  Pain or burning when peeing.  Blood in the pee.  Pee that smells bad or not like normal.  Trouble peeing.  Pee that is cloudy.  Fluid coming from the vagina, if you are female.  Pain in the belly or lower back. Other symptoms include:  Throwing up (vomiting).  No urge to eat.  Feeling mixed up (confused).  Being tired  and grouchy (irritable).  A fever.  Watery poop (diarrhea). How is this treated? This condition may be treated with:  Antibiotic medicine.  Other medicines.  Drinking enough water. Follow these instructions at home:  Medicines  Take over-the-counter and prescription medicines only as told by your doctor.  If you were prescribed an antibiotic medicine, take it as told by your doctor. Do not stop taking it even if you start to feel better. General instructions  Make sure you: ? Pee until your bladder is empty. ? Do not hold pee for a long time. ? Empty your bladder after sex. ? Wipe from front to back after pooping if you are a female. Use each tissue one time when you wipe.  Drink enough fluid to keep your pee pale yellow.  Keep all follow-up visits as told by your doctor. This is important. Contact a doctor if:  You do not get better after 1-2 days.  Your symptoms go away and then come back. Get help right away if:  You have very bad back pain.  You have very bad pain in your lower belly.  You have a fever.  You are sick to your stomach (nauseous).  You are throwing up. Summary  A urinary tract infection (UTI) is an infection of any part of the urinary tract.  This condition is caused by germs in your genital area.  There are many risk factors for a UTI. These include having a small, thin   tube to drain pee and not being able to control when you pee or poop.  Treatment includes antibiotic medicines for germs.  Drink enough fluid to keep your pee pale yellow. This information is not intended to replace advice given to you by your health care provider. Make sure you discuss any questions you have with your health care provider. Document Revised: 06/01/2018 Document Reviewed: 12/22/2017 Elsevier Patient Education  2020 Elsevier Inc.  

## 2020-06-06 NOTE — Progress Notes (Signed)

## 2020-06-06 NOTE — Progress Notes (Signed)
06/06/2020 10:59 AM   [name] [birth year] [id number]  Referring provider: No referring provider defined for this encounter.  followup recurrent UTI  HPI: [name] is a 34yo here for followup for recurrent UTIs. No UTIs since last visit. She does have new dysuria and urinary frequency. UA concerning for infection. She underwent CT which did not show any GU abnormalities. No gross hematuria   PMH: Past Medical History:  Diagnosis Date  . ADD (attention deficit disorder)   . Family history of colon cancer   . Family history of gene mutation    RAD51D - paternal side  . Family history of kidney cancer   . Family history of malignant neoplasm of ovary   . Family history of throat cancer   . GERD (gastroesophageal reflux disease)   . History of cervical dysplasia   . History of gestational diabetes   . Menorrhagia   . PONV (postoperative nausea and vomiting)   . S/P cervical polypectomy 05/29/2020  . S/P endometrial ablation 05/29/2020  . Status post bilateral salpingectomy 05/29/2020  . Wears contact lenses     Surgical History: Past Surgical History:  Procedure Laterality Date  . CESAREAN SECTION  03/2007  . CESAREAN SECTION N/A 06/10/2017   Procedure: REPEAT CESAREAN SECTION;  Surgeon: Janyth Contes, MD;  Location: [facility];  Service: Obstetrics;  Laterality: N/A;  [place] K RNFA  . CESAREAN SECTION N/A 10/23/2019   Procedure: CESAREAN SECTION;  Surgeon: Janyth Contes, MD;  Location: [facility];  Service: Obstetrics;  Laterality: N/A;  . DILATION AND CURETTAGE OF UTERUS N/A 06/10/2017   Procedure: DILATATION AND CURETTAGE;  Surgeon: Janyth Contes, MD;  Location: [facility];  Service: Gynecology;  Laterality: N/A;  . DILITATION & CURRETTAGE/HYSTROSCOPY WITH NOVASURE ABLATION N/A 05/29/2020   Procedure: DILATATION & CURETTAGE/HYSTEROSCOPY WITH NOVASURE ABLATION, CERVICAL LACERATION REPAIR;  Surgeon: Janyth Contes, MD;   Location: [facility];  Service: Gynecology;  Laterality: N/A;  . IRRIGATION AND DEBRIDEMENT ABSCESS N/A 02/19/2016   Procedure: IRRIGATION AND DEBRIDEMENT LEFT LABIAL ABSCESS;  Surgeon: Coralie Keens, MD;  Location: [facility];  Service: General;  Laterality: N/A;  . LAPAROSCOPIC BILATERAL SALPINGECTOMY Bilateral 05/29/2020   Procedure: LAPAROSCOPIC BILATERAL SALPINGECTOMY;  Surgeon: Janyth Contes, MD;  Location: [facility];  Service: Gynecology;  Laterality: Bilateral;  . LAPAROSCOPIC CHOLECYSTECTOMY  2010  . TONSILLECTOMY  72 months old  . WISDOM TOOTH EXTRACTION      Home Medications:  Allergies as of 06/06/2020      Reactions   Sulfa Antibiotics Anaphylaxis   Latex Swelling, Other (See Comments)   Redness.    Lidocaine Palpitations      Medication List       Accurate as of June 06, 2020 10:59 AM. If you have any questions, ask your nurse or doctor.        AZO-CRANBERRY PO Take by mouth daily.   calcium carbonate 500 MG chewable tablet Commonly known as: TUMS - dosed in mg elemental calcium Chew 1 tablet by mouth as needed for indigestion or heartburn.   fluticasone 50 MCG/ACT nasal spray Commonly known as: FLONASE Place into both nostrils daily as needed.   ibuprofen 800 MG tablet Commonly known as: ADVIL Take 1 tablet (800 mg total) by mouth every 8 (eight) hours as needed for moderate pain.   multivitamin tablet Take 1 tablet by mouth daily.   oxyCODONE 5 MG immediate release tablet Commonly known as: Roxicodone Take 1 tablet (5 mg  total) by mouth every 6 (six) hours as needed for severe pain.       Allergies:  Allergies  Allergen Reactions  . Sulfa Antibiotics Anaphylaxis  . Latex Swelling and Other (See Comments)    Redness.   . Lidocaine Palpitations    Family History: Family History  Problem Relation Age of Onset  . Kidney disease Mother   . Cancer Paternal Aunt        ovarian?  . Other Paternal Aunt         RAD51D gene mutation  . Throat cancer Paternal Uncle        dx 50s/60s  . Asthma Maternal Grandmother   . COPD Maternal Grandmother   . Diabetes Maternal Grandmother   . Hypertension Maternal Grandmother   . Macular degeneration Maternal Grandmother   . Heart attack Maternal Grandfather   . Congestive Heart Failure Maternal Grandfather   . Coronary artery disease Maternal Grandfather   . Hypertension Maternal Grandfather   . Alcoholism Maternal Grandfather   . Colon cancer Paternal Grandmother        dx in her 72s  . Cancer Paternal Grandmother 43       ovarian?  . Other Paternal Grandmother        RAD51D gene mutation  . Kidney cancer Paternal Grandfather   . Cancer Maternal Great-grandmother        unknown type, dx >50  . Cancer Cousin 40       ovarian? (paternal first cousin)  . Other Cousin        RAD51D gene mutation (paternal first cousin)    Social History:  reports that she has been smoking cigarettes. She has a 7.50 pack-year smoking history. She has never used smokeless tobacco. She reports current alcohol use. She reports that she does not use drugs.  ROS: All other review of systems were reviewed and are negative except what is noted above in HPI  Physical Exam: BP 103/68   Pulse 92   Temp 98.9 F (37.2 C)   Ht 5' 2"  (1.575 m)   Wt 151 lb 8 oz (68.7 kg)   BMI 27.71 kg/m   Constitutional:  Alert and oriented, No acute distress. HEENT: [place] AT, moist mucus membranes.  Trachea midline, no masses. Cardiovascular: No clubbing, cyanosis, or edema. Respiratory: Normal respiratory effort, no increased work of breathing. GI: Abdomen is soft, nontender, nondistended, no abdominal masses GU: No CVA tenderness.  Lymph: No cervical or inguinal lymphadenopathy. Skin: No rashes, bruises or suspicious lesions. Neurologic: Grossly intact, no focal deficits, moving all 4 extremities. Psychiatric: Normal mood and affect.  Laboratory Data: Lab Results  Component  Value Date   WBC 10.1 05/29/2020   HGB 13.8 05/29/2020   HCT 41.1 05/29/2020   MCV 92.4 05/29/2020   PLT 298 05/29/2020    Lab Results  Component Value Date   CREATININE 0.66 05/29/2020    No results found for: PSA  No results found for: TESTOSTERONE  No results found for: HGBA1C  Urinalysis    Component Value Date/Time   COLORURINE YELLOW 11/05/2017 1119   APPEARANCEUR Clear 06/06/2020 1006   LABSPEC 1.020 11/05/2017 1119   PHURINE 5.0 11/05/2017 1119   GLUCOSEU Negative 06/06/2020 1006   HGBUR LARGE (A) 11/05/2017 1119   BILIRUBINUR Negative 06/06/2020 1006   KETONESUR negative 04/14/2020 1330   KETONESUR 5 (A) 11/05/2017 1119   PROTEINUR 2+ (A) 06/06/2020 1006   PROTEINUR NEGATIVE 11/05/2017 1119   UROBILINOGEN 0.2 04/14/2020 1330  NITRITE Negative 06/06/2020 1006   NITRITE NEGATIVE 11/05/2017 1119   LEUKOCYTESUR 1+ (A) 06/06/2020 1006    Lab Results  Component Value Date   LABMICR See below: 06/06/2020   WBCUA 6-10 (A) 06/06/2020   LABEPIT 0-10 06/06/2020   MUCUS Present 06/06/2020   BACTERIA Few 06/06/2020    Pertinent Imaging: CT abd/pelvis 05/15/2020: Images reviewed and discussed with the patient No results found for this or any previous visit.  No results found for this or any previous visit.  No results found for this or any previous visit.  No results found for this or any previous visit.  No results found for this or any previous visit.  No results found for this or any previous visit.  Results for orders placed during the hospital encounter of 05/15/20  CT HEMATURIA WORKUP  Narrative CLINICAL DATA:  Hematuria.  EXAM: CT ABDOMEN AND PELVIS WITHOUT AND WITH CONTRAST  TECHNIQUE: Multidetector CT imaging of the abdomen and pelvis was performed following the standard protocol before and following the bolus administration of intravenous contrast.  CONTRAST:  181m OMNIPAQUE IOHEXOL 300 MG/ML  SOLN  COMPARISON:  Abdomen/pelvis CT  02/19/2016  FINDINGS: Lower chest: Unremarkable  Hepatobiliary: Small area of low attenuation in the anterior liver, adjacent to the falciform ligament, is in a characteristic location for focal fatty deposition. No suspicious focal abnormality within the liver parenchyma. Gallbladder is surgically absent. No intrahepatic or extrahepatic biliary dilation.  Pancreas: No focal mass lesion. No dilatation of the main duct. No intraparenchymal cyst. No peripancreatic edema.  Spleen: No splenomegaly. No focal mass lesion.  Adrenals/Urinary Tract: No adrenal nodule or mass.  Precontrast imaging shows no stones in either kidney or ureter. No bladder stones.  Imaging after IV contrast administration shows no suspicious enhancing mass lesion in either kidney. Tiny hypodensity in the lower pole right kidney is too small to characterize at 3 mm but most likely benign. Delayed post-contrast imaging shows no wall thickening or soft tissue filling defect in either intrarenal collecting system or renal pelvis. Both ureters are well opacified without evidence for wall thickening, soft tissue lesion or focal dilatation. Delayed imaging of the bladder shows no focal wall thickening or mass lesion.  Stomach/Bowel: Stomach is unremarkable. No gastric wall thickening. No evidence of outlet obstruction. Duodenum is normally positioned as is the ligament of Treitz. No small bowel wall thickening. No small bowel dilatation. The terminal ileum is normal. The appendix is normal. Colon is diffusely decompressed.  Vascular/Lymphatic: No abdominal aortic aneurysm. There is no gastrohepatic or hepatoduodenal ligament lymphadenopathy. No retroperitoneal or mesenteric lymphadenopathy. No pelvic sidewall lymphadenopathy.  Reproductive: The uterus is unremarkable. There is no adnexal mass. 2.5 x 1.7 cm cyst identified in the region of the left vaginal introitus, characteristic appearance/location for  Bartholin's cyst.  Other: No intraperitoneal free fluid.  Musculoskeletal: No worrisome lytic or sclerotic osseous abnormality.  IMPRESSION: 1. No urinary stone disease. No findings to explain the patient's history of hematuria and recurrent UTI. 2. No acute findings in the abdomen/pelvis.   Electronically Signed By: EMisty StanleyM.D. On: 05/16/2020 10:00  No results found for this or any previous visit.   Assessment & Plan:    1. Recurrent UTI -urine for culture. Macrobid 1053mBID for 7 days. She will drop off a urine in 1 week to ensure resolution of the UTI. RTC 4 weeks for cystosocpy - Urinalysis, Routine w reflex microscopic   Return in about 4 weeks (around 07/04/2020) for cystoscopy.  Nicolette Bang, MD  [facility] [place]

## 2020-06-09 LAB — URINE CULTURE

## 2020-06-09 NOTE — Progress Notes (Signed)
Results sent via my chart 

## 2020-06-13 ENCOUNTER — Other Ambulatory Visit: Payer: Self-pay

## 2020-06-13 ENCOUNTER — Other Ambulatory Visit: Payer: Medicaid Other

## 2020-06-13 DIAGNOSIS — N390 Urinary tract infection, site not specified: Secondary | ICD-10-CM

## 2020-06-15 LAB — URINE CULTURE

## 2020-06-16 NOTE — Progress Notes (Signed)
Results sent via my chart 

## 2020-07-17 ENCOUNTER — Other Ambulatory Visit: Payer: Medicaid Other | Admitting: Urology

## 2020-09-03 ENCOUNTER — Other Ambulatory Visit: Payer: Medicaid Other | Admitting: Urology

## 2020-09-05 ENCOUNTER — Other Ambulatory Visit: Payer: Self-pay | Admitting: Obstetrics and Gynecology

## 2020-09-05 DIAGNOSIS — N6310 Unspecified lump in the right breast, unspecified quadrant: Secondary | ICD-10-CM

## 2020-09-05 DIAGNOSIS — N6320 Unspecified lump in the left breast, unspecified quadrant: Secondary | ICD-10-CM

## 2020-09-23 ENCOUNTER — Other Ambulatory Visit: Payer: Self-pay | Admitting: Gynecology

## 2020-09-26 ENCOUNTER — Other Ambulatory Visit: Payer: Self-pay

## 2020-09-26 ENCOUNTER — Ambulatory Visit: Payer: Medicaid Other

## 2020-09-26 ENCOUNTER — Ambulatory Visit
Admission: RE | Admit: 2020-09-26 | Discharge: 2020-09-26 | Disposition: A | Discharge status: Home / Self Care | Payer: Medicaid Other | Source: Ambulatory Visit | Attending: Obstetrics and Gynecology | Admitting: Obstetrics and Gynecology

## 2020-09-26 DIAGNOSIS — N6310 Unspecified lump in the right breast, unspecified quadrant: Secondary | ICD-10-CM

## 2020-09-26 DIAGNOSIS — N6320 Unspecified lump in the left breast, unspecified quadrant: Secondary | ICD-10-CM

## 2020-12-04 ENCOUNTER — Other Ambulatory Visit: Payer: Self-pay

## 2020-12-04 ENCOUNTER — Other Ambulatory Visit: Payer: Self-pay | Admitting: Urology

## 2020-12-04 ENCOUNTER — Other Ambulatory Visit: Payer: Medicaid Other

## 2020-12-04 ENCOUNTER — Telehealth: Payer: Self-pay

## 2020-12-04 DIAGNOSIS — N390 Urinary tract infection, site not specified: Secondary | ICD-10-CM

## 2020-12-04 DIAGNOSIS — R8290 Unspecified abnormal findings in urine: Secondary | ICD-10-CM

## 2020-12-04 LAB — URINALYSIS, ROUTINE W REFLEX MICROSCOPIC
Bilirubin, UA: NEGATIVE
Leukocytes,UA: NEGATIVE
Nitrite, UA: POSITIVE — AB
Specific Gravity, UA: 1.02 (ref 1.005–1.030)
Urobilinogen, Ur: 1 mg/dL (ref 0.2–1.0)
pH, UA: 5 (ref 5.0–7.5)

## 2020-12-04 LAB — MICROSCOPIC EXAMINATION: Renal Epithel, UA: NONE SEEN /hpf

## 2020-12-04 MED ORDER — NITROFURANTOIN MONOHYD MACRO 100 MG PO CAPS: 100.0000 mg | ORAL_CAPSULE | Freq: Two times a day (BID) | ORAL | 0 refills | Status: DC

## 2020-12-04 MED ORDER — PHENAZOPYRIDINE HCL 100 MG PO TABS: 100.0000 mg | ORAL_TABLET | Freq: Three times a day (TID) | ORAL | 0 refills | Status: DC | PRN

## 2020-12-04 NOTE — Telephone Encounter (Addendum)
Patient called back and wanted a prescription for Pyridium and a message was sent to Dr. Alyson Ingles.

## 2020-12-04 NOTE — Telephone Encounter (Signed)
Please see previous note by Barnet Pall, CMA Per Dr. Alyson Ingles patient is to start Macrobid 100 po bid x 7 days. Patient called and made aware.

## 2020-12-04 NOTE — Progress Notes (Signed)
Patient called and reports that she was having some urinary frequency and some urgency in the middle of the night. She reports that she had taken an AZO and is feeling some better. No other concerns.

## 2020-12-04 NOTE — Addendum Note (Signed)
Addended by: Dessie Coma on: 12/04/2020 10:16 AM   Modules accepted: Orders

## 2020-12-06 LAB — URINE CULTURE

## 2020-12-08 NOTE — Progress Notes (Signed)
Sent via mychart

## 2020-12-16 ENCOUNTER — Other Ambulatory Visit: Payer: Medicaid Other

## 2020-12-16 ENCOUNTER — Other Ambulatory Visit: Payer: Self-pay

## 2020-12-16 ENCOUNTER — Telehealth: Payer: Self-pay

## 2020-12-16 DIAGNOSIS — N390 Urinary tract infection, site not specified: Secondary | ICD-10-CM

## 2020-12-16 LAB — URINALYSIS, ROUTINE W REFLEX MICROSCOPIC
Bilirubin, UA: NEGATIVE
Glucose, UA: NEGATIVE
Leukocytes,UA: NEGATIVE
Nitrite, UA: NEGATIVE
RBC, UA: NEGATIVE
Specific Gravity, UA: >1.03 — ABNORMAL HIGH (ref 1.005–1.030)
Urobilinogen, Ur: 0.2 mg/dL (ref 0.2–1.0)
pH, UA: 6.5 (ref 5.0–7.5)

## 2020-12-16 LAB — MICROSCOPIC EXAMINATION
Epithelial Cells (non renal): >10 /hpf — AB (ref 0–10)
RBC, Urine: NONE SEEN /hpf (ref 0–2)
Renal Epithel, UA: NONE SEEN /hpf

## 2020-12-16 NOTE — Progress Notes (Signed)
Patient c/o burning with urination. Urine sent for ua and culture

## 2020-12-16 NOTE — Telephone Encounter (Signed)
Patient dropped off urine. Wants to have scope   Please call patient back with results.  Call back:  [phone]  Thanks,  Helene Kelp

## 2020-12-17 NOTE — Telephone Encounter (Signed)
Patient dropped of ua/uc on Tuesday 06/21 after symptoms of dysuria returned.  Instructed patient to increase fluids while culture is pending. UA was no definite for infection.  Patient was to f/u in dec with cysto but has been unable to get to office for cysto. New appt for cysto scheduled with patient.

## 2020-12-18 LAB — URINE CULTURE

## 2020-12-22 NOTE — Progress Notes (Signed)
Sent via mychart

## 2021-01-09 ENCOUNTER — Encounter: Payer: Self-pay | Admitting: Urology

## 2021-01-09 ENCOUNTER — Ambulatory Visit (INDEPENDENT_AMBULATORY_CARE_PROVIDER_SITE_OTHER): Payer: Medicaid Other | Admitting: Urology

## 2021-01-09 ENCOUNTER — Other Ambulatory Visit: Payer: Self-pay

## 2021-01-09 VITALS — BP 113/76 | HR 80 | Temp 97.6°F

## 2021-01-09 DIAGNOSIS — N390 Urinary tract infection, site not specified: Secondary | ICD-10-CM | POA: Diagnosis not present

## 2021-01-09 LAB — URINALYSIS, ROUTINE W REFLEX MICROSCOPIC
Bilirubin, UA: NEGATIVE
Glucose, UA: NEGATIVE
Ketones, UA: NEGATIVE
Leukocytes,UA: NEGATIVE
Nitrite, UA: NEGATIVE
Protein,UA: NEGATIVE
Specific Gravity, UA: 1.015 (ref 1.005–1.030)
Urobilinogen, Ur: 0.2 mg/dL (ref 0.2–1.0)
pH, UA: 7 (ref 5.0–7.5)

## 2021-01-09 LAB — MICROSCOPIC EXAMINATION
Epithelial Cells (non renal): NONE SEEN /hpf (ref 0–10)
Renal Epithel, UA: NONE SEEN /hpf

## 2021-01-09 MED ORDER — NITROFURANTOIN MACROCRYSTAL 50 MG PO CAPS: 50.0000 mg | ORAL_CAPSULE | Freq: Every day | ORAL | 11 refills | Status: DC

## 2021-01-09 MED ORDER — CIPROFLOXACIN HCL 500 MG PO TABS
500.0000 mg | ORAL_TABLET | Freq: Once | ORAL | Status: AC
  Administered 2021-01-09: 500 mg via ORAL

## 2021-01-09 NOTE — Progress Notes (Signed)
Urological Symptom Review  Patient is experiencing the following symptoms: Frequent urination Get up at night to urinate Urinary tract infection   Review of Systems  Gastrointestinal (upper)  : Negative for upper GI symptoms  Gastrointestinal (lower) : Negative for lower GI symptoms  Constitutional : Negative for symptoms  Skin: Negative for skin symptoms  Eyes: Negative for eye symptoms  Ear/Nose/Throat : Negative for Ear/Nose/Throat symptoms  Hematologic/Lymphatic: Negative for Hematologic/Lymphatic symptoms  Cardiovascular : Negative for cardiovascular symptoms  Respiratory : Negative for respiratory symptoms  Endocrine: Negative for endocrine symptoms  Musculoskeletal: Negative for musculoskeletal symptoms  Neurological: Negative for neurological symptoms  Psychologic: Negative for psychiatric symptoms

## 2021-01-09 NOTE — Patient Instructions (Signed)

## 2021-01-09 NOTE — Progress Notes (Signed)
   01/09/21  CC: recurrent UTI   HPI: [name] is a 35yo here for cystoscopy Blood pressure 113/76, pulse 80, temperature 97.6 F (36.4 C). NED. A&Ox3.   No respiratory distress   Abd soft, NT, ND Normal external genitalia with patent urethral meatus  Cystoscopy Procedure Note  Patient identification was confirmed, informed consent was obtained, and patient was prepped using Betadine solution.  Lidocaine jelly was administered per urethral meatus.    Procedure: - Flexible cystoscope introduced, without any difficulty.   - Thorough search of the bladder revealed:    normal urethral meatus    normal urothelium    no stones    no ulcers     no tumors    no urethral polyps    no trabeculation  - Ureteral orifices were normal in position and appearance.  Post-Procedure: - Patient tolerated the procedure well  Assessment/ Plan: -We discussed the natural hx of recurrent UTIs and the various causes. We discussed the treatment options including post coital prophylaxis, daily prophylaxis, topical estrogen therapy. We will trial macrobid 50mg  qhs   No follow-ups on file.  Nicolette Bang, MD

## 2021-02-10 ENCOUNTER — Telehealth: Payer: Self-pay

## 2021-02-10 MED ORDER — FLUCONAZOLE 150 MG PO TABS: 150.0000 mg | ORAL_TABLET | Freq: Once | ORAL | 1 refills | Status: AC

## 2021-02-10 NOTE — Telephone Encounter (Signed)
Patient called complaining of vaginal discomfort and discharge. Patient request medication for a yeast infection. Per Dr. Alyson Ingles diflucan sent to pharmacy. Patient called and aware.

## 2021-02-23 ENCOUNTER — Telehealth: Payer: Self-pay

## 2021-02-23 NOTE — Telephone Encounter (Signed)
Returned patient call. Left message for patient

## 2021-02-23 NOTE — Telephone Encounter (Signed)
Left message if symptoms are persistent she may want to go to urgent care as office is closed today. Will attempt to reach pt tomorrow.

## 2021-02-23 NOTE — Telephone Encounter (Signed)
Patient called Friday with UTI symptoms - frequent, burning during and after urination.  Wants to speak with a nurse to know what to do.  Please advise asap.  Call back:  [phone]  Thanks, Helene Kelp

## 2021-02-24 ENCOUNTER — Other Ambulatory Visit: Payer: Self-pay

## 2021-02-24 ENCOUNTER — Ambulatory Visit
Admission: EM | Admit: 2021-02-24 | Discharge: 2021-02-24 | Disposition: A | Discharge status: Home / Self Care | Payer: Medicaid Other | Attending: Physician Assistant | Admitting: Physician Assistant

## 2021-02-24 DIAGNOSIS — N390 Urinary tract infection, site not specified: Secondary | ICD-10-CM | POA: Diagnosis not present

## 2021-02-24 LAB — POCT URINALYSIS DIP (MANUAL ENTRY)
Bilirubin, UA: NEGATIVE
Glucose, UA: NEGATIVE mg/dL
Ketones, POC UA: NEGATIVE mg/dL
Leukocytes, UA: NEGATIVE
Nitrite, UA: NEGATIVE
Protein Ur, POC: NEGATIVE mg/dL
Spec Grav, UA: 1.015
Urobilinogen, UA: 0.2 U/dL
pH, UA: 7

## 2021-02-24 MED ORDER — NITROFURANTOIN MONOHYD MACRO 100 MG PO CAPS: 100.0000 mg | ORAL_CAPSULE | Freq: Two times a day (BID) | ORAL | 0 refills | Status: AC

## 2021-02-24 MED ORDER — FLUCONAZOLE 150 MG PO TABS: 150.0000 mg | ORAL_TABLET | Freq: Every day | ORAL | 0 refills | Status: AC

## 2021-02-24 NOTE — Discharge Instructions (Addendum)
Return if any problems.

## 2021-02-24 NOTE — ED Triage Notes (Signed)
Pt is present today with dysuria. Pt state that she tried OTC medication and daily nitrofurantoin and nothing has helped. Pt states that her sx started last Friday

## 2021-02-25 NOTE — Telephone Encounter (Signed)
Followed up with patient. Patient went to urgent care due to our office being closed. Patient stated they treated her UTI symptoms.

## 2021-04-08 NOTE — ED Provider Notes (Signed)
[facility]    CSN: [id number] Arrival date & time: 02/24/21  1325      History   Chief Complaint Chief Complaint  Patient presents with   Dysuria    HPI Laurie Warren is a 35 y.o. female.   The history is provided by the patient. No language interpreter was used.  Dysuria Pain quality:  Aching Onset quality:  Gradual Progression:  Worsening Risk factors: not pregnant    Past Medical History:  Diagnosis Date   ADD (attention deficit disorder)    Family history of colon cancer    Family history of gene mutation    RAD51D - paternal side   Family history of kidney cancer    Family history of malignant neoplasm of ovary    Family history of throat cancer    GERD (gastroesophageal reflux disease)    History of cervical dysplasia    History of gestational diabetes    Menorrhagia    PONV (postoperative nausea and vomiting)    S/P cervical polypectomy 05/29/2020   S/P endometrial ablation 05/29/2020   Status post bilateral salpingectomy 05/29/2020   Wears contact lenses     Patient Active Problem List   Diagnosis Date Noted   Status post bilateral salpingectomy 05/29/2020   S/P endometrial ablation 05/29/2020   S/P cervical polypectomy 05/29/2020   Genetic testing 05/07/2020   Recurrent UTI 05/05/2020   Family history of gene mutation    Family history of colon cancer    Family history of kidney cancer    Family history of throat cancer    Status post repeat low transverse cesarean section 10/24/2019   Normal pregnancy in multigravida in third trimester 06/10/2017   S/P cesarean section 06/10/2017   PPH (postpartum hemorrhage) 06/10/2017   Abscess of left genital labia s/p I&D 02/19/2016 02/19/2016    Past Surgical History:  Procedure Laterality Date   CESAREAN SECTION  03/2007   CESAREAN SECTION N/A 06/10/2017   Procedure: REPEAT CESAREAN SECTION;  Surgeon: Janyth Contes, MD;  Location: [place];  Service: Obstetrics;  Laterality:  N/A;  Nira Conn K RNFA   CESAREAN SECTION N/A 10/23/2019   Procedure: CESAREAN SECTION;  Surgeon: Janyth Contes, MD;  Location: [place] [facility];  Service: Obstetrics;  Laterality: N/A;   DILATION AND CURETTAGE OF UTERUS N/A 06/10/2017   Procedure: DILATATION AND CURETTAGE;  Surgeon: Janyth Contes, MD;  Location: [place];  Service: Gynecology;  Laterality: N/A;   DILITATION & CURRETTAGE/HYSTROSCOPY WITH NOVASURE ABLATION N/A 05/29/2020   Procedure: DILATATION & CURETTAGE/HYSTEROSCOPY WITH NOVASURE ABLATION, CERVICAL LACERATION REPAIR;  Surgeon: Janyth Contes, MD;  Location: [place];  Service: Gynecology;  Laterality: N/A;   IRRIGATION AND DEBRIDEMENT ABSCESS N/A 02/19/2016   Procedure: IRRIGATION AND DEBRIDEMENT LEFT LABIAL ABSCESS;  Surgeon: Coralie Keens, MD;  Location: [facility];  Service: General;  Laterality: N/A;   LAPAROSCOPIC BILATERAL SALPINGECTOMY Bilateral 05/29/2020   Procedure: LAPAROSCOPIC BILATERAL SALPINGECTOMY;  Surgeon: Janyth Contes, MD;  Location: [place];  Service: Gynecology;  Laterality: Bilateral;   LAPAROSCOPIC CHOLECYSTECTOMY  2010   TONSILLECTOMY  61 months old   WISDOM TOOTH EXTRACTION      OB History     Gravida  3   Para  2   Term  2   Preterm      AB      Living  2      SAB      IAB      Ectopic  Multiple  0   Live Births  2            Home Medications    Prior to Admission medications   Medication Sig Start Date End Date Taking? Authorizing Provider  fluconazole (DIFLUCAN) 150 MG tablet Take 1 tablet (150 mg total) by mouth daily. 02/24/21  Yes Caryl Ada K, PA-C  AZO-CRANBERRY PO Take by mouth daily.     [provider]  calcium carbonate (TUMS - DOSED IN MG ELEMENTAL CALCIUM) 500 MG chewable tablet Chew 1 tablet by mouth as needed for indigestion or heartburn.    [provider]  fluticasone (FLONASE) 50 MCG/ACT nasal spray Place into  both nostrils daily as needed.     [provider]  ibuprofen (ADVIL) 800 MG tablet Take 1 tablet (800 mg total) by mouth every 8 (eight) hours as needed for moderate pain. Patient not taking: Reported on 01/09/2021 05/29/20   Janyth Contes, MD  Multiple Vitamin (MULTIVITAMIN) tablet Take 1 tablet by mouth daily.    [provider]  nitrofurantoin (MACRODANTIN) 50 MG capsule Take 1 capsule (50 mg total) by mouth at bedtime. 01/09/21   McKenzie, Candee Furbish, MD  oxyCODONE (ROXICODONE) 5 MG immediate release tablet Take 1 tablet (5 mg total) by mouth every 6 (six) hours as needed for severe pain. Patient not taking: Reported on 01/09/2021 05/29/20   Janyth Contes, MD  phenazopyridine (PYRIDIUM) 100 MG tablet Take 1 tablet (100 mg total) by mouth 3 (three) times daily as needed for pain. Patient not taking: Reported on 01/09/2021 12/04/20   Cleon Gustin, MD    Family History Family History  Problem Relation Age of Onset   Kidney disease Mother    Cancer Paternal Aunt        ovarian?   Other Paternal Aunt        RAD51D gene mutation   Throat cancer Paternal Uncle        dx 50s/60s   Asthma Maternal Grandmother    COPD Maternal Grandmother    Diabetes Maternal Grandmother    Hypertension Maternal Grandmother    Macular degeneration Maternal Grandmother    Heart attack Maternal Grandfather    Congestive Heart Failure Maternal Grandfather    Coronary artery disease Maternal Grandfather    Hypertension Maternal Grandfather    Alcoholism Maternal Grandfather    Colon cancer Paternal Grandmother        dx in her 71s   Cancer Paternal Grandmother 69       ovarian?   Other Paternal Grandmother        RAD51D gene mutation   Kidney cancer Paternal Grandfather    Cancer Maternal Great-grandmother        unknown type, dx >50   Cancer Cousin 19       ovarian? (paternal first cousin)   Other Cousin        RAD51D gene mutation (paternal first cousin)     Social History Social History   Tobacco Use   Smoking status: Every Day    Packs/day: 0.50    Years: 15.00    Pack years: 7.50    Types: Cigarettes   Smokeless tobacco: Never  Vaping Use   Vaping Use: Never used  Substance Use Topics   Alcohol use: Yes    Comment: social drinker   Drug use: Never     Allergies   Sulfa antibiotics, Latex, and Lidocaine   Review of Systems Review of Systems  Genitourinary:  Positive for dysuria.  All other systems reviewed and are negative.   Physical Exam Triage Vital Signs ED Triage Vitals  Enc Vitals Group     BP 02/24/21 1436 109/65     Pulse Rate 02/24/21 1436 91     Resp 02/24/21 1436 17     Temp 02/24/21 1436 98.8 F (37.1 C)     Temp src --      SpO2 02/24/21 1436 96 %     Weight --      Height --      Head Circumference --      Peak Flow --      Pain Score 02/24/21 1435 5     Pain Loc --      Pain Edu? --      Excl. in [place]? --    No data found.  Updated Vital Signs BP 109/65   Pulse 91   Temp 98.8 F (37.1 C)   Resp 17   SpO2 96%   Visual Acuity Right Eye Distance:   Left Eye Distance:   Bilateral Distance:    Right Eye Near:   Left Eye Near:    Bilateral Near:     Physical Exam Vitals reviewed.  Constitutional:      Appearance: Normal appearance.  Cardiovascular:     Rate and Rhythm: Normal rate.  Pulmonary:     Effort: Pulmonary effort is normal.  Abdominal:     General: Abdomen is flat.  Skin:    General: Skin is warm.  Neurological:     General: No focal deficit present.     Mental Status: She is alert.  Psychiatric:        Mood and Affect: Mood normal.     UC Treatments / Results  Labs (all labs ordered are listed, but only abnormal results are displayed) Labs Reviewed  POCT URINALYSIS DIP (MANUAL ENTRY) - Abnormal; Notable for the following components:      Result Value   Blood, UA trace-intact (*)    All other components within normal limits    EKG   Radiology No  results found.  Procedures Procedures (including critical care time)  Medications Ordered in UC Medications - No data to display  Initial Impression / Assessment and Plan / UC Course  I have reviewed the triage vital signs and the nursing notes.  Pertinent labs & imaging results that were available during my care of the patient were reviewed by me and considered in my medical decision making (see chart for details).      Final Clinical Impressions(s) / UC Diagnoses   Final diagnoses:  Urinary tract infection without hematuria, site unspecified     Discharge Instructions      Return if any problems.    ED Prescriptions     Medication Sig Dispense Auth. Provider   nitrofurantoin, macrocrystal-monohydrate, (MACROBID) 100 MG capsule Take 1 capsule (100 mg total) by mouth 2 (two) times daily for 7 days. 14 capsule Nitisha Civello K, [place]   fluconazole (DIFLUCAN) 150 MG tablet Take 1 tablet (150 mg total) by mouth daily. 2 tablet Fransico Meadow, [place]      PDMP not reviewed this encounter. An After Visit Summary was printed and given to the patient.    Fransico Meadow, [place] 04/08/21 [phone]

## 2021-04-15 ENCOUNTER — Ambulatory Visit: Payer: Medicaid Other | Admitting: Urology

## 2021-04-22 ENCOUNTER — Other Ambulatory Visit: Payer: Self-pay

## 2021-04-22 ENCOUNTER — Ambulatory Visit (INDEPENDENT_AMBULATORY_CARE_PROVIDER_SITE_OTHER): Payer: Medicaid Other | Admitting: Urology

## 2021-04-22 ENCOUNTER — Encounter: Payer: Self-pay | Admitting: Urology

## 2021-04-22 VITALS — BP 111/74 | HR 96 | Temp 98.8°F | Wt 148.0 lb

## 2021-04-22 DIAGNOSIS — N390 Urinary tract infection, site not specified: Secondary | ICD-10-CM

## 2021-04-22 DIAGNOSIS — N3281 Overactive bladder: Secondary | ICD-10-CM

## 2021-04-22 LAB — URINALYSIS, ROUTINE W REFLEX MICROSCOPIC
Bilirubin, UA: NEGATIVE
Glucose, UA: NEGATIVE
Ketones, UA: NEGATIVE
Leukocytes,UA: NEGATIVE
Nitrite, UA: NEGATIVE
Protein,UA: NEGATIVE
Specific Gravity, UA: 1.015 (ref 1.005–1.030)
Urobilinogen, Ur: 0.2 mg/dL (ref 0.2–1.0)
pH, UA: 7 (ref 5.0–7.5)

## 2021-04-22 LAB — MICROSCOPIC EXAMINATION
Bacteria, UA: NONE SEEN
Epithelial Cells (non renal): NONE SEEN /hpf (ref 0–10)
RBC, Urine: NONE SEEN /hpf (ref 0–2)
Renal Epithel, UA: NONE SEEN /hpf
WBC, UA: NONE SEEN /hpf (ref 0–5)

## 2021-04-22 MED ORDER — MIRABEGRON ER 25 MG PO TB24: 25.0000 mg | ORAL_TABLET | Freq: Every day | ORAL | 0 refills | Status: DC

## 2021-04-22 NOTE — Progress Notes (Signed)
04/22/2021 11:15 AM   Laurie Warren 1985/07/01 [id number]  Referring provider: No referring provider defined for this encounter.  Followup recurrent UTI and urinary urgency   HPI: Laurie Warren is a 35yo here for followup for recurrent UTI. No UTIs since last visit since starting the macrobid. She has urinary urgency, frequency and daily urge incontinence. Urine stream is strong. No straining to urinate. She has nocturia 1-3x. No dysuira. No other complaints today.    PMH: Past Medical History:  Diagnosis Date   ADD (attention deficit disorder)    Family history of colon cancer    Family history of gene mutation    RAD51D - paternal side   Family history of kidney cancer    Family history of malignant neoplasm of ovary    Family history of throat cancer    GERD (gastroesophageal reflux disease)    History of cervical dysplasia    History of gestational diabetes    Menorrhagia    PONV (postoperative nausea and vomiting)    S/P cervical polypectomy 05/29/2020   S/P endometrial ablation 05/29/2020   Status post bilateral salpingectomy 05/29/2020   Wears contact lenses     Surgical History: Past Surgical History:  Procedure Laterality Date   CESAREAN SECTION  03/2007   CESAREAN SECTION N/A 06/10/2017   Procedure: REPEAT CESAREAN SECTION;  Surgeon: Janyth Contes, MD;  Location: [place];  Service: Obstetrics;  Laterality: N/A;  Nira Conn K RNFA   CESAREAN SECTION N/A 10/23/2019   Procedure: CESAREAN SECTION;  Surgeon: Janyth Contes, MD;  Location: [place] [facility];  Service: Obstetrics;  Laterality: N/A;   DILATION AND CURETTAGE OF UTERUS N/A 06/10/2017   Procedure: DILATATION AND CURETTAGE;  Surgeon: Janyth Contes, MD;  Location: [place];  Service: Gynecology;  Laterality: N/A;   DILITATION & CURRETTAGE/HYSTROSCOPY WITH NOVASURE ABLATION N/A 05/29/2020   Procedure: DILATATION & CURETTAGE/HYSTEROSCOPY WITH NOVASURE ABLATION, CERVICAL LACERATION  REPAIR;  Surgeon: Janyth Contes, MD;  Location: [place];  Service: Gynecology;  Laterality: N/A;   IRRIGATION AND DEBRIDEMENT ABSCESS N/A 02/19/2016   Procedure: IRRIGATION AND DEBRIDEMENT LEFT LABIAL ABSCESS;  Surgeon: Coralie Keens, MD;  [facility];  Service: General;  Laterality: N/A;   LAPAROSCOPIC BILATERAL SALPINGECTOMY Bilateral 05/29/2020   Procedure: LAPAROSCOPIC BILATERAL SALPINGECTOMY;  Surgeon: Janyth Contes, MD;  Location: [place];  Service: Gynecology;  Laterality: Bilateral;   LAPAROSCOPIC CHOLECYSTECTOMY  2010   TONSILLECTOMY  57 months old   WISDOM TOOTH EXTRACTION      Home Medications:  Allergies as of 04/22/2021       Reactions   Sulfa Antibiotics Anaphylaxis   Latex Swelling, Other (See Comments)   Redness.    Lidocaine Palpitations        Medication List        Accurate as of April 22, 2021 11:15 AM. If you have any questions, ask your nurse or doctor.          AZO-CRANBERRY PO Take by mouth daily.   calcium carbonate 500 MG chewable tablet Commonly known as: TUMS - dosed in mg elemental calcium Chew 1 tablet by mouth as needed for indigestion or heartburn.   escitalopram 10 MG tablet Commonly known as: LEXAPRO Take 10 mg by mouth daily.   fluconazole 150 MG tablet Commonly known as: Diflucan Take 1 tablet (150 mg total) by mouth daily.   fluticasone 50 MCG/ACT nasal spray Commonly known as: FLONASE Place into both nostrils daily as needed.  ibuprofen 800 MG tablet Commonly known as: ADVIL Take 1 tablet (800 mg total) by mouth every 8 (eight) hours as needed for moderate pain.   multivitamin tablet Take 1 tablet by mouth daily.   nitrofurantoin 50 MG capsule Commonly known as: MACRODANTIN Take 1 capsule (50 mg total) by mouth at bedtime.   oxyCODONE 5 MG immediate release tablet Commonly known as: Roxicodone Take 1 tablet (5 mg total) by mouth every 6 (six) hours as  needed for severe pain.   phenazopyridine 100 MG tablet Commonly known as: Pyridium Take 1 tablet (100 mg total) by mouth 3 (three) times daily as needed for pain.        Allergies:  Allergies  Allergen Reactions   Sulfa Antibiotics Anaphylaxis   Latex Swelling and Other (See Comments)    Redness.    Lidocaine Palpitations    Family History: Family History  Problem Relation Age of Onset   Kidney disease Mother    Cancer Paternal Aunt        ovarian?   Other Paternal Aunt        RAD51D gene mutation   Throat cancer Paternal Uncle        dx 50s/60s   Asthma Maternal Grandmother    COPD Maternal Grandmother    Diabetes Maternal Grandmother    Hypertension Maternal Grandmother    Macular degeneration Maternal Grandmother    Heart attack Maternal Grandfather    Congestive Heart Failure Maternal Grandfather    Coronary artery disease Maternal Grandfather    Hypertension Maternal Grandfather    Alcoholism Maternal Grandfather    Colon cancer Paternal Grandmother        dx in her 27s   Cancer Paternal Grandmother 68       ovarian?   Other Paternal Grandmother        RAD51D gene mutation   Kidney cancer Paternal Grandfather    Cancer Maternal Great-grandmother        unknown type, dx >50   Cancer Cousin 85       ovarian? (paternal first cousin)   Other Cousin        RAD51D gene mutation (paternal first cousin)    Social History:  reports that she has been smoking cigarettes. She has a 7.50 pack-year smoking history. She has never used smokeless tobacco. She reports current alcohol use. She reports that she does not use drugs.  ROS: All other review of systems were reviewed and are negative except what is noted above in HPI  Physical Exam: BP 111/74   Pulse 96   Temp 98.8 F (37.1 C)   Wt 148 lb (67.1 kg)   BMI 27.07 kg/m   Constitutional:  Alert and oriented, No acute distress. HEENT: [place] AT, moist mucus membranes.  Trachea midline, no  masses. Cardiovascular: No clubbing, cyanosis, or edema. Respiratory: Normal respiratory effort, no increased work of breathing. GI: Abdomen is soft, nontender, nondistended, no abdominal masses GU: No CVA tenderness.  Lymph: No cervical or inguinal lymphadenopathy. Skin: No rashes, bruises or suspicious lesions. Neurologic: Grossly intact, no focal deficits, moving all 4 extremities. Psychiatric: Normal mood and affect.  Laboratory Data: Lab Results  Component Value Date   WBC 10.1 05/29/2020   HGB 13.8 05/29/2020   HCT 41.1 05/29/2020   MCV 92.4 05/29/2020   PLT 298 05/29/2020    Lab Results  Component Value Date   CREATININE 0.66 05/29/2020    No results found for: PSA  No results found for:  TESTOSTERONE  No results found for: HGBA1C  Urinalysis    Component Value Date/Time   COLORURINE YELLOW 11/05/2017 1119   APPEARANCEUR Clear 01/09/2021 1326   LABSPEC 1.020 11/05/2017 1119   PHURINE 5.0 11/05/2017 1119   GLUCOSEU Negative 01/09/2021 1326   HGBUR LARGE (A) 11/05/2017 1119   BILIRUBINUR negative 02/24/2021 1435   BILIRUBINUR Negative 01/09/2021 1326   KETONESUR negative 02/24/2021 1435   KETONESUR 5 (A) 11/05/2017 1119   PROTEINUR negative 02/24/2021 1435   PROTEINUR Negative 01/09/2021 1326   PROTEINUR NEGATIVE 11/05/2017 1119   UROBILINOGEN 0.2 02/24/2021 1435   NITRITE Negative 02/24/2021 1435   NITRITE Negative 01/09/2021 1326   NITRITE NEGATIVE 11/05/2017 1119   LEUKOCYTESUR Negative 02/24/2021 1435   LEUKOCYTESUR Negative 01/09/2021 1326    Lab Results  Component Value Date   LABMICR See below: 01/09/2021   WBCUA 0-5 01/09/2021   LABEPIT None seen 01/09/2021   MUCUS Present 12/16/2020   BACTERIA Few (A) 01/09/2021    Pertinent Imaging:  No results found for this or any previous visit.  No results found for this or any previous visit.  No results found for this or any previous visit.  No results found for this or any previous  visit.  No results found for this or any previous visit.  No results found for this or any previous visit.  Results for orders placed during the hospital encounter of 05/15/20  CT HEMATURIA WORKUP  Narrative CLINICAL DATA:  Hematuria.  EXAM: CT ABDOMEN AND PELVIS WITHOUT AND WITH CONTRAST  TECHNIQUE: Multidetector CT imaging of the abdomen and pelvis was performed following the standard protocol before and following the bolus administration of intravenous contrast.  CONTRAST:  144m OMNIPAQUE IOHEXOL 300 MG/ML  SOLN  COMPARISON:  Abdomen/pelvis CT 02/19/2016  FINDINGS: Lower chest: Unremarkable  Hepatobiliary: Small area of low attenuation in the anterior liver, adjacent to the falciform ligament, is in a characteristic location for focal fatty deposition. No suspicious focal abnormality within the liver parenchyma. Gallbladder is surgically absent. No intrahepatic or extrahepatic biliary dilation.  Pancreas: No focal mass lesion. No dilatation of the main duct. No intraparenchymal cyst. No peripancreatic edema.  Spleen: No splenomegaly. No focal mass lesion.  Adrenals/Urinary Tract: No adrenal nodule or mass.  Precontrast imaging shows no stones in either kidney or ureter. No bladder stones.  Imaging after IV contrast administration shows no suspicious enhancing mass lesion in either kidney. Tiny hypodensity in the lower pole right kidney is too small to characterize at 3 mm but most likely benign. Delayed post-contrast imaging shows no wall thickening or soft tissue filling defect in either intrarenal collecting system or renal pelvis. Both ureters are well opacified without evidence for wall thickening, soft tissue lesion or focal dilatation. Delayed imaging of the bladder shows no focal wall thickening or mass lesion.  Stomach/Bowel: Stomach is unremarkable. No gastric wall thickening. No evidence of outlet obstruction. Duodenum is normally positioned as is  the ligament of Treitz. No small bowel wall thickening. No small bowel dilatation. The terminal ileum is normal. The appendix is normal. Colon is diffusely decompressed.  Vascular/Lymphatic: No abdominal aortic aneurysm. There is no gastrohepatic or hepatoduodenal ligament lymphadenopathy. No retroperitoneal or mesenteric lymphadenopathy. No pelvic sidewall lymphadenopathy.  Reproductive: The uterus is unremarkable. There is no adnexal mass. 2.5 x 1.7 cm cyst identified in the region of the left vaginal introitus, characteristic appearance/location for Bartholin's cyst.  Other: No intraperitoneal free fluid.  Musculoskeletal: No worrisome lytic or sclerotic osseous  abnormality.  IMPRESSION: 1. No urinary stone disease. No findings to explain the patient's history of hematuria and recurrent UTI. 2. No acute findings in the abdomen/pelvis.   Electronically Signed By: Misty Stanley M.D. On: 05/16/2020 10:00  No results found for this or any previous visit.   Assessment & Plan:    1. Recurrent UTI -continue macrobid 74m qhs - Urinalysis, Routine w reflex microscopic  2. Overactive bladder -Mirabegron 220mdaily   No follow-ups on file.  PaNicolette BangMD  [facility] [place]

## 2021-04-22 NOTE — Patient Instructions (Signed)

## 2021-04-22 NOTE — Progress Notes (Signed)
Urological Symptom Review  Patient is experiencing the following symptoms: Frequent urination Get up at night to urinate   Review of Systems  Gastrointestinal (upper)  : Negative for upper GI symptoms  Gastrointestinal (lower) : Negative for lower GI symptoms  Constitutional : Negative for symptoms  Skin: Negative for skin symptoms  Eyes: Negative for eye symptoms  Ear/Nose/Throat : Negative for Ear/Nose/Throat symptoms  Hematologic/Lymphatic: Negative for Hematologic/Lymphatic symptoms  Cardiovascular : Negative for cardiovascular symptoms  Respiratory : Negative for respiratory symptoms  Endocrine: Negative for endocrine symptoms  Musculoskeletal: Negative for musculoskeletal symptoms  Neurological: Negative for neurological symptoms  Psychologic: Anxiety

## 2021-05-18 ENCOUNTER — Telehealth: Payer: Self-pay

## 2021-05-18 DIAGNOSIS — N3281 Overactive bladder: Secondary | ICD-10-CM

## 2021-05-18 MED ORDER — MIRABEGRON ER 25 MG PO TB24: 25.0000 mg | ORAL_TABLET | Freq: Every day | ORAL | 0 refills | Status: DC

## 2021-05-20 NOTE — Telephone Encounter (Signed)
Any alternative patient could try that would be cheaper?   Patient notified samples left at desk until advised of new medication. Patient voiced understanding

## 2021-05-20 NOTE — Telephone Encounter (Signed)
Patient cannot afford $500 out of pocket for mirabegron ER (MYRBETRIQ) 25 MG TB24 tablet Wants to know if another off brand can be called in.  Also wants to know if there are any samples in yet that she can pick up.  Please fill at: [place], [place] Phone:  [phone]  Fax:  [phone]     Please advise.  Call back:  [phone] Laurie Warren)   Thanks, Laurie Warren

## 2021-05-22 NOTE — Telephone Encounter (Signed)
Insurance will cover oxybutynin and ER, Solifenacin and Toviaz. Please advise

## 2021-05-25 MED ORDER — SOLIFENACIN SUCCINATE 5 MG PO TABS: 5.0000 mg | ORAL_TABLET | Freq: Every day | ORAL | 5 refills | Status: DC

## 2021-05-25 NOTE — Telephone Encounter (Signed)
New rx sent to pharmacy, patient called and notified of new rx sent and to stop myrbetriq 25mg . Patient voiced understanding.

## 2021-06-08 ENCOUNTER — Encounter: Payer: Self-pay | Admitting: Urology

## 2021-06-08 ENCOUNTER — Ambulatory Visit (INDEPENDENT_AMBULATORY_CARE_PROVIDER_SITE_OTHER): Payer: Medicaid Other | Admitting: Urology

## 2021-06-08 ENCOUNTER — Other Ambulatory Visit: Payer: Self-pay

## 2021-06-08 VITALS — BP 98/62 | HR 88

## 2021-06-08 DIAGNOSIS — N3281 Overactive bladder: Secondary | ICD-10-CM

## 2021-06-08 DIAGNOSIS — N390 Urinary tract infection, site not specified: Secondary | ICD-10-CM

## 2021-06-08 MED ORDER — MIRABEGRON ER 25 MG PO TB24
25.0000 mg | ORAL_TABLET | Freq: Every day | ORAL | 11 refills | Status: DC
Start: 2021-06-08 — End: 2021-08-28

## 2021-06-08 NOTE — Progress Notes (Signed)
06/08/2021 3:41 PM   Laurie Warren Aug 13, 1985 [id number]  Referring provider: No referring provider defined for this encounter.  Followup OAb and recurrent UTI   HPI: Laurie Warren is a 35yo here for followup for recurrent UTI and OAB. She was given mirabegron 43m which significantly improved her OAb symptoms and pelvic pain. Insurance would not cover mirabegron and she was switched to vesicare 563mdaily which failed to improve her urinary frequency, urgency and pelvic pain. Nocturia 3-4x on vesicare and 0x on mirabegron. No UTIs since last visit.    PMH: Past Medical History:  Diagnosis Date   ADD (attention deficit disorder)    Family history of colon cancer    Family history of gene mutation    RAD51D - paternal side   Family history of kidney cancer    Family history of malignant neoplasm of ovary    Family history of throat cancer    GERD (gastroesophageal reflux disease)    History of cervical dysplasia    History of gestational diabetes    Menorrhagia    PONV (postoperative nausea and vomiting)    S/P cervical polypectomy 05/29/2020   S/P endometrial ablation 05/29/2020   Status post bilateral salpingectomy 05/29/2020   Wears contact lenses     Surgical History: Past Surgical History:  Procedure Laterality Date   CESAREAN SECTION  03/2007   CESAREAN SECTION N/A 06/10/2017   Procedure: REPEAT CESAREAN SECTION;  Surgeon: BoJanyth ContesMD;  [facility] Service: Obstetrics;  Laterality: N/A;  HeNira Conn RNFA   CESAREAN SECTION N/A 10/23/2019   Procedure: CESAREAN SECTION;  Surgeon: BoJanyth ContesMD;  [facility];  Service: Obstetrics;  Laterality: N/A;   DILATION AND CURETTAGE OF UTERUS N/A 06/10/2017   Procedure: DILATATION AND CURETTAGE;  Surgeon: BoJanyth ContesMD;  [facility]: [facility]necology;  Laterality: N/A;   DILITATION & CURRETTAGE/HYSTROSCOPY WITH NOVASURE ABLATION N/A 05/29/2020    Procedure: DILATATION & CURETTAGE/HYSTEROSCOPY WITH NOVASURE ABLATION, CERVICAL LACERATION REPAIR;  Surgeon: BoJanyth ContesMD;  [facility] Service: Gynecology;  Laterality: N/A;   IRRIGATION AND DEBRIDEMENT ABSCESS N/A 02/19/2016   Procedure: IRRIGATION AND DEBRIDEMENT LEFT LABIAL ABSCESS;  Surgeon: DoCoralie KeensMD;  Location: [facility];  Service: General;  Laterality: N/A;   LAPAROSCOPIC BILATERAL SALPINGECTOMY Bilateral 05/29/2020   Procedure: LAPAROSCOPIC BILATERAL SALPINGECTOMY;  Surgeon: BoJanyth ContesMD;  [facility]: Gy[facility]cology;  Laterality: Bilateral;   LAPAROSCOPIC CHOLECYSTECTOMY  2010   TONSILLECTOMY  [age]onths old   WISDOM TOOTH EXTRACTION      Home Medications:  Allergies as of 06/08/2021       Reactions   Sulfa Antibiotics Anaphylaxis   Latex Swelling, Other (See Comments)   Redness.    Lidocaine Palpitations        Medication List        Accurate as of June 08, 2021  3:41 PM. If you have any questions, ask your nurse or doctor.          AZO-CRANBERRY PO Take by mouth daily.   calcium carbonate 500 MG chewable tablet Commonly known as: TUMS - dosed in mg elemental calcium Chew 1 tablet by mouth as needed for indigestion or heartburn.   escitalopram 10 MG tablet Commonly known as: LEXAPRO Take 10 mg by mouth daily.   fluconazole 150 MG tablet Commonly known as: Diflucan Take 1 tablet (150 mg total) by mouth daily.   fluticasone  50 MCG/ACT nasal spray Commonly known as: FLONASE Place into both nostrils daily as needed.   ibuprofen 800 MG tablet Commonly known as: ADVIL Take 1 tablet (800 mg total) by mouth every 8 (eight) hours as needed for moderate pain.   multivitamin tablet Take 1 tablet by mouth daily.   Myrbetriq 25 MG Tb24 tablet Generic drug: mirabegron ER Take 25 mg by mouth daily.   nitrofurantoin 50 MG capsule Commonly known as: MACRODANTIN Take 1  capsule (50 mg total) by mouth at bedtime.   oxyCODONE 5 MG immediate release tablet Commonly known as: Roxicodone Take 1 tablet (5 mg total) by mouth every 6 (six) hours as needed for severe pain.   phenazopyridine 100 MG tablet Commonly known as: Pyridium Take 1 tablet (100 mg total) by mouth 3 (three) times daily as needed for pain.   solifenacin 5 MG tablet Commonly known as: VESICARE Take 1 tablet (5 mg total) by mouth daily.        Allergies:  Allergies  Allergen Reactions   Sulfa Antibiotics Anaphylaxis   Latex Swelling and Other (See Comments)    Redness.    Lidocaine Palpitations    Family History: Family History  Problem Relation Age of Onset   Kidney disease Mother    Cancer Paternal Aunt        ovarian?   Other Paternal Aunt        RAD51D gene mutation   Throat cancer Paternal Uncle        dx 50s/60s   Asthma Maternal Grandmother    COPD Maternal Grandmother    Diabetes Maternal Grandmother    Hypertension Maternal Grandmother    Macular degeneration Maternal Grandmother    Heart attack Maternal Grandfather    Congestive Heart Failure Maternal Grandfather    Coronary artery disease Maternal Grandfather    Hypertension Maternal Grandfather    Alcoholism Maternal Grandfather    Colon cancer Paternal Grandmother        dx in her 42s   Cancer Paternal Grandmother 87       ovarian?   Other Paternal Grandmother        RAD51D gene mutation   Kidney cancer Paternal Grandfather    Cancer Maternal Great-grandmother        unknown type, dx >50   Cancer Cousin 42       ovarian? (paternal first cousin)   Other Cousin        RAD51D gene mutation (paternal first cousin)    Social History:  reports that she has been smoking cigarettes. She has a 7.50 pack-year smoking history. She has never used smokeless tobacco. She reports current alcohol use. She reports that she does not use drugs.  ROS: All other review of systems were reviewed and are negative  except what is noted above in HPI  Physical Exam: BP 98/62   Pulse 88   Constitutional:  Alert and oriented, No acute distress. HEENT: [place] AT, moist mucus membranes.  Trachea midline, no masses. Cardiovascular: No clubbing, cyanosis, or edema. Respiratory: Normal respiratory effort, no increased work of breathing. GI: Abdomen is soft, nontender, nondistended, no abdominal masses GU: No CVA tenderness.  Lymph: No cervical or inguinal lymphadenopathy. Skin: No rashes, bruises or suspicious lesions. Neurologic: Grossly intact, no focal deficits, moving all 4 extremities. Psychiatric: Normal mood and affect.  Laboratory Data: Lab Results  Component Value Date   WBC 10.1 05/29/2020   HGB 13.8 05/29/2020   HCT 41.1 05/29/2020   MCV  92.4 05/29/2020   PLT 298 05/29/2020    Lab Results  Component Value Date   CREATININE 0.66 05/29/2020    No results found for: PSA  No results found for: TESTOSTERONE  No results found for: HGBA1C  Urinalysis    Component Value Date/Time   COLORURINE YELLOW 11/05/2017 1119   APPEARANCEUR Clear 04/22/2021 1105   LABSPEC 1.020 11/05/2017 1119   PHURINE 5.0 11/05/2017 1119   GLUCOSEU Negative 04/22/2021 1105   HGBUR LARGE (A) 11/05/2017 1119   BILIRUBINUR Negative 04/22/2021 1105   KETONESUR negative 02/24/2021 1435   KETONESUR 5 (A) 11/05/2017 1119   PROTEINUR Negative 04/22/2021 1105   PROTEINUR NEGATIVE 11/05/2017 1119   UROBILINOGEN 0.2 02/24/2021 1435   NITRITE Negative 04/22/2021 1105   NITRITE NEGATIVE 11/05/2017 1119   LEUKOCYTESUR Negative 04/22/2021 1105    Lab Results  Component Value Date   LABMICR See below: 04/22/2021   WBCUA None seen 04/22/2021   LABEPIT None seen 04/22/2021   MUCUS Present 12/16/2020   BACTERIA None seen 04/22/2021    Pertinent Imaging:  No results found for this or any previous visit.  No results found for this or any previous visit.  No results found for this or any previous visit.  No  results found for this or any previous visit.  No results found for this or any previous visit.  No results found for this or any previous visit.  Results for orders placed during the hospital encounter of 05/15/20  CT HEMATURIA WORKUP  Narrative CLINICAL DATA:  Hematuria.  EXAM: CT ABDOMEN AND PELVIS WITHOUT AND WITH CONTRAST  TECHNIQUE: Multidetector CT imaging of the abdomen and pelvis was performed following the standard protocol before and following the bolus administration of intravenous contrast.  CONTRAST:  158m OMNIPAQUE IOHEXOL 300 MG/ML  SOLN  COMPARISON:  Abdomen/pelvis CT 02/19/2016  FINDINGS: Lower chest: Unremarkable  Hepatobiliary: Small area of low attenuation in the anterior liver, adjacent to the falciform ligament, is in a characteristic location for focal fatty deposition. No suspicious focal abnormality within the liver parenchyma. Gallbladder is surgically absent. No intrahepatic or extrahepatic biliary dilation.  Pancreas: No focal mass lesion. No dilatation of the main duct. No intraparenchymal cyst. No peripancreatic edema.  Spleen: No splenomegaly. No focal mass lesion.  Adrenals/Urinary Tract: No adrenal nodule or mass.  Precontrast imaging shows no stones in either kidney or ureter. No bladder stones.  Imaging after IV contrast administration shows no suspicious enhancing mass lesion in either kidney. Tiny hypodensity in the lower pole right kidney is too small to characterize at 3 mm but most likely benign. Delayed post-contrast imaging shows no wall thickening or soft tissue filling defect in either intrarenal collecting system or renal pelvis. Both ureters are well opacified without evidence for wall thickening, soft tissue lesion or focal dilatation. Delayed imaging of the bladder shows no focal wall thickening or mass lesion.  Stomach/Bowel: Stomach is unremarkable. No gastric wall thickening. No evidence of outlet obstruction.  Duodenum is normally positioned as is the ligament of Treitz. No small bowel wall thickening. No small bowel dilatation. The terminal ileum is normal. The appendix is normal. Colon is diffusely decompressed.  Vascular/Lymphatic: No abdominal aortic aneurysm. There is no gastrohepatic or hepatoduodenal ligament lymphadenopathy. No retroperitoneal or mesenteric lymphadenopathy. No pelvic sidewall lymphadenopathy.  Reproductive: The uterus is unremarkable. There is no adnexal mass. 2.5 x 1.7 cm cyst identified in the region of the left vaginal introitus, characteristic appearance/location for Bartholin's cyst.  Other: No  intraperitoneal free fluid.  Musculoskeletal: No worrisome lytic or sclerotic osseous abnormality.  IMPRESSION: 1. No urinary stone disease. No findings to explain the patient's history of hematuria and recurrent UTI. 2. No acute findings in the abdomen/pelvis.   Electronically Signed By: Misty Stanley M.D. On: 05/16/2020 10:00  No results found for this or any previous visit.   Assessment & Plan:    1. OAB (overactive bladder) -We will restart mirabegron 67m daily - Urinalysis, Routine w reflex microscopic  2. Recurrent UTI Post coital macrobid - Urinalysis, Routine w reflex microscopic   No follow-ups on file.  PNicolette Bang MD  [facility] [place]

## 2021-06-08 NOTE — Progress Notes (Signed)

## 2021-06-08 NOTE — Patient Instructions (Signed)

## 2021-06-09 LAB — MICROSCOPIC EXAMINATION: Renal Epithel, UA: NONE SEEN /hpf

## 2021-06-09 LAB — URINALYSIS, ROUTINE W REFLEX MICROSCOPIC
Bilirubin, UA: NEGATIVE
Glucose, UA: NEGATIVE
Leukocytes,UA: NEGATIVE
Nitrite, UA: NEGATIVE
Specific Gravity, UA: 1.02 (ref 1.005–1.030)
Urobilinogen, Ur: 1 mg/dL (ref 0.2–1.0)
pH, UA: 7 (ref 5.0–7.5)

## 2021-07-21 ENCOUNTER — Telehealth: Payer: Self-pay

## 2021-07-21 NOTE — Telephone Encounter (Signed)
Patient called to state vesicare did not help improve urinary symptoms  Dr. Alyson Ingles resent myrbetriq 25mg  to pharmacy. Patient needs a tier exception completed for this medication. We will work on this exception.

## 2021-07-22 DIAGNOSIS — R102 Pelvic and perineal pain: Secondary | ICD-10-CM | POA: Diagnosis present

## 2021-08-03 ENCOUNTER — Encounter (HOSPITAL_BASED_OUTPATIENT_CLINIC_OR_DEPARTMENT_OTHER): Payer: Self-pay | Admitting: Obstetrics and Gynecology

## 2021-08-03 ENCOUNTER — Other Ambulatory Visit: Payer: Self-pay

## 2021-08-03 NOTE — Progress Notes (Signed)
PLEASE WEAR A MASK OUT IN PUBLIC AND SOCIAL DISTANCE AND [place] YOUR HANDS FREQUENTLY. PLEASE ASK ALL YOUR CLOSE HOUSEHOLD CONTACT TO WEAR MASK OUT IN PUBLIC AND SOCIAL DISTANCE AND [place] HANDS FREQUENTLY ALSO.      Your procedure is scheduled on Thursday, 08/13/21.  Report to [place] AT  5:30 am.   Call this number if you have problems the morning of surgery  :[phone].   OUR ADDRESS IS [address].  WE ARE LOCATED IN [facility].  PLEASE BRING YOUR INSURANCE CARD AND PHOTO ID DAY OF SURGERY.  ONLY ONE PERSON ALLOWED IN FACILITY WAITING AREA.                                     REMEMBER:  DO NOT EAT FOOD, CANDY GUM OR MINTS  AFTER MIDNIGHT THE NIGHT BEFORE YOUR SURGERY . YOU MAY HAVE CLEAR LIQUIDS FROM MIDNIGHT THE NIGHT BEFORE YOUR SURGERY UNTIL  4:30 am. NO CLEAR LIQUIDS AFTER   4:30 am DAY OF SURGERY.   YOU MAY  BRUSH YOUR TEETH MORNING OF SURGERY AND RINSE YOUR MOUTH OUT, NO CHEWING GUM CANDY OR MINTS.    CLEAR LIQUID DIET   Foods Allowed                                                                     Foods Excluded  Coffee and tea, regular and decaf                             liquids that you cannot  Plain Jell-O any favor except red or purple                                           see through such as: Fruit ices (not with fruit pulp)                                     milk, soups, orange juice  Iced Popsicles                                    All solid food Carbonated beverages, regular and diet                                    Cranberry, grape and apple juices Sports drinks like Gatorade  Sample Menu Breakfast                                Lunch  Supper Cranberry juice                                           Jell-O                                     Grape juice                           Apple juice Coffee or tea                        Jell-O                                       Popsicle                                                Coffee or tea                        Coffee or tea  _____________________________________________________________________     TAKE THESE MEDICATIONS MORNING OF SURGERY WITH A SIP OF WATER:    Lexapro, Myrbetriq  ONE VISITOR IS ALLOWED IN WAITING ROOM ONLY DAY OF SURGERY.  YOU MAY HAVE ANOTHER PERSON SWITCH OUT WITH THE  1  VISITOR IN THE WAITING ROOM DAY OF SURGERY AND A MASK MUST BE WORN IN THE WAITING ROOM.    2 VISITORS  MAY VISIT IN THE EXTENDED RECOVERY ROOM UNTIL 800 PM ONLY 1 VISITOR AGE 61 AND OVER MAY SPEND THE NIGHT AND MUST BE IN EXTENDED RECOVERY ROOM NO LATER THAN 800 PM .    UP TO 2 CHILDREN AGE 63 TO 15 MAY ALSO VISIT IN EXTENDED RECOV ERY ROOM ONLY UNTIL 800 PM AND MUST LEAVE BY 800 PM.   ALL PERSONS VISITING IN EXTENDED RECOVERY ROOM MUST WEAR A MASK.                                    DO NOT WEAR JEWERLY, MAKE UP. DO NOT WEAR LOTIONS, POWDERS, PERFUMES OR NAIL POLISH ON YOUR FINGERNAILS. TOENAIL POLISH IS OK TO WEAR. DO NOT SHAVE FOR 48 HOURS PRIOR TO DAY OF SURGERY. MEN MAY SHAVE FACE AND NECK. CONTACTS, GLASSES, OR DENTURES MAY NOT BE WORN TO SURGERY.                                     IS NOT RESPONSIBLE  FOR ANY BELONGINGS.                                                                    Marland Kitchen  [place] - Preparing for Surgery Before surgery, you can play an important role.  Because skin is not sterile, your skin needs to be as free of germs as possible.  You can reduce the number of germs on your skin by washing with CHG (chlorahexidine gluconate) soap before surgery.  CHG is an antiseptic cleaner which kills germs and bonds with the skin to continue killing germs even after washing. Please DO NOT use if you have an allergy to CHG or antibacterial soaps.  If your skin becomes reddened/irritated stop using the CHG and inform your nurse when you arrive at Short Stay. Do not shave  (including legs and underarms) for at least 48 hours prior to the first CHG shower.  You may shave your face/neck. Please follow these instructions carefully:  1.  Shower with CHG Soap the night before surgery and the  morning of Surgery.  2.  If you choose to wash your hair, wash your hair first as usual with your  normal  shampoo.  3.  After you shampoo, rinse your hair and body thoroughly to remove the  shampoo.                            4.  Use CHG as you would any other liquid soap.  You can apply chg directly  to the skin and wash                      Gently with a scrungie or clean washcloth.  5.  Apply the CHG Soap to your body ONLY FROM THE NECK DOWN.   Do not use on face/ open                           Wound or open sores. Avoid contact with eyes, ears mouth and genitals (private parts).                       Wash face,  Genitals (private parts) with your normal soap.             6.  Wash thoroughly, paying special attention to the area where your surgery  will be performed.  7.  Thoroughly rinse your body with warm water from the neck down.  8.  DO NOT shower/wash with your normal soap after using and rinsing off  the CHG Soap.                9.  Pat yourself dry with a clean towel.            10.  Wear clean pajamas.            11.  Place clean sheets on your bed the night of your first shower and do not  sleep with pets. Day of Surgery : Do not apply any lotions/deodorants the morning of surgery.  Please wear clean clothes to the hospital/surgery center.  IF YOU HAVE ANY SKIN IRRITATION OR PROBLEMS WITH THE SURGICAL SOAP, PLEASE GET A BAR OF GOLD DIAL SOAP AND SHOWER THE NIGHT BEFORE YOUR SURGERY AND THE MORNING OF YOUR SURGERY. PLEASE LET THE NURSE KNOW MORNING OF YOUR SURGERY IF YOU HAD ANY PROBLEMS WITH THE SURGICAL SOAP.   ________________________________________________________________________  QUESTIONS CALL Lane[facility] PRE  OP NURSE PHONE [phone].

## 2021-08-03 NOTE — Progress Notes (Signed)
Spoke w/ via phone for pre-op interview---pt Lab needs dos---- urine pregnancy POCT              Lab results------08/10/21 lab appt for CBC, CMP, type & screen COVID test -----patient states asymptomatic no test needed Arrive at -------0530 on Thursday, 08/13/21 NPO after MN NO Solid Food.  Clear liquids from MN until---0430 Med rec completed Medications to take morning of surgery -----Lexapro, Myrbetriq Diabetic medication -----n/a Patient instructed no nail polish to be worn day of surgery Patient instructed to bring photo id and insurance card day of surgery Patient aware to have Driver (ride ) / caregiver    for 24 hours after surgery - husband Marjory Lies Patient Special Instructions -----Extended stay instructions given. Pre-Op special Istructions -----During pre-op phone call with patient she stated that she often cries for no reason when coming out of anesthesia. Patient verbalized understanding of instructions that were given at this phone interview. Patient denies shortness of breath, chest pain, fever, cough at this phone interview.

## 2021-08-08 ENCOUNTER — Encounter (HOSPITAL_BASED_OUTPATIENT_CLINIC_OR_DEPARTMENT_OTHER): Payer: Self-pay | Admitting: Obstetrics and Gynecology

## 2021-08-08 DIAGNOSIS — N83202 Unspecified ovarian cyst, left side: Secondary | ICD-10-CM | POA: Diagnosis present

## 2021-08-08 DIAGNOSIS — N939 Abnormal uterine and vaginal bleeding, unspecified: Secondary | ICD-10-CM | POA: Diagnosis present

## 2021-08-08 HISTORY — DX: Abnormal uterine and vaginal bleeding, unspecified: N93.9

## 2021-08-08 HISTORY — DX: Unspecified ovarian cyst, left side: N83.202

## 2021-08-08 NOTE — H&P (Signed)
Laurie Warren is an 36 y.o. female G3P3 with menorrhagia/AUB and persistent L ovarian cyst for LAVH/ LO/cysto for definitive management.  Has had normal lab eval of persistent ovarian cyst.  D/W pt r/b/a of LAVH/LO. Cysto also process and expectations.  US reveals L ovarian cyst 2-3cm, peripheral vascularity - nl lab eval.  Will proceed w surgery  Pertinent Gynecological History: OB History: G3, P3003, LTCS x 3, CPD (9# infant)and repeat x 2  H/O abn pap, last 2020, HR HPV neg BTL (distal salpingectomy w LTCS #3 Remote h/o Chl Neg genetic screening for known familial mutation  Menstrual History: Patient's last menstrual period was 07/30/2021 (exact date).    Past Medical History:  Diagnosis Date   Abnormal uterine bleeding (AUB) 08/08/2021   ADD (attention deficit disorder)    mostly as a child   Anxiety    Family history of colon cancer    Family history of gene mutation    RAD51D - paternal side   Family history of kidney cancer    Family history of malignant neoplasm of ovary    Family history of throat cancer    Frequent UTI 2021   GERD (gastroesophageal reflux disease)    mostly while pregnant   Gestational diabetes    with all 3 pregnancies   History of cervical dysplasia    History of gestational diabetes    Menorrhagia    Ovarian cyst, left 08/08/2021   Overactive bladder    PONV (postoperative nausea and vomiting)    PPH (postpartum hemorrhage) 06/10/2017   S/P cervical polypectomy 05/29/2020   S/P endometrial ablation 05/29/2020   Status post bilateral salpingectomy 05/29/2020   Wears contact lenses     Past Surgical History:  Procedure Laterality Date   CESAREAN SECTION  03/2007   CESAREAN SECTION N/A 06/10/2017   Procedure: REPEAT CESAREAN SECTION;  Surgeon: Janyth Contes, MD;  Location: [place];  Service: Obstetrics;  Laterality: N/A;  Nira Conn K RNFA   CESAREAN SECTION N/A 10/23/2019   Procedure: CESAREAN SECTION;  Surgeon: Janyth Contes, MD;  Location: [place] [facility];  Service: Obstetrics;  Laterality: N/A;   DILATION AND CURETTAGE OF UTERUS N/A 06/10/2017   Procedure: DILATATION AND CURETTAGE;  Surgeon: Janyth Contes, MD;  Location: [place];  Service: Gynecology;  Laterality: N/A;   DILITATION & CURRETTAGE/HYSTROSCOPY WITH NOVASURE ABLATION N/A 05/29/2020   Procedure: DILATATION & CURETTAGE/HYSTEROSCOPY WITH NOVASURE ABLATION, CERVICAL LACERATION REPAIR;  Surgeon: Janyth Contes, MD;  Location: [place];  Service: Gynecology;  Laterality: N/A;   IRRIGATION AND DEBRIDEMENT ABSCESS N/A 02/19/2016   Procedure: IRRIGATION AND DEBRIDEMENT LEFT LABIAL ABSCESS;  Surgeon: Coralie Keens, MD;  [facility];  Service: General;  Laterality: N/A;   LAPAROSCOPIC BILATERAL SALPINGECTOMY Bilateral 05/29/2020   Procedure: LAPAROSCOPIC BILATERAL SALPINGECTOMY;  Surgeon: Janyth Contes, MD;  Location: [place];  Service: Gynecology;  Laterality: Bilateral;   LAPAROSCOPIC CHOLECYSTECTOMY  2010   TONSILLECTOMY  74 months old   WISDOM TOOTH EXTRACTION      Family History  Problem Relation Age of Onset   Kidney disease Mother    Cancer Paternal Aunt        ovarian?   Other Paternal Aunt        RAD51D gene mutation   Throat cancer Paternal Uncle        dx 50s/60s   Asthma Maternal Grandmother    COPD Maternal Grandmother    Diabetes Maternal Grandmother    Hypertension Maternal  Grandmother    Macular degeneration Maternal Grandmother    Heart attack Maternal Grandfather    Congestive Heart Failure Maternal Grandfather    Coronary artery disease Maternal Grandfather    Hypertension Maternal Grandfather    Alcoholism Maternal Grandfather    Colon cancer Paternal Grandmother        dx in her 28s   Cancer Paternal Grandmother 47       ovarian?   Other Paternal Grandmother        RAD51D gene mutation   Kidney cancer Paternal Grandfather    Cancer Maternal  Great-grandmother        unknown type, dx >50   Cancer Cousin 9       ovarian? (paternal first cousin)   Other Cousin        RAD51D gene mutation (paternal first cousin)    Social History:  reports that she has been smoking cigarettes. She has a 7.50 pack-year smoking history. She has never used smokeless tobacco. She reports current alcohol use. She reports that she does not use drugs.married, SAHM  Allergies:  Allergies  Allergen Reactions   Sulfa Antibiotics Anaphylaxis   Latex Swelling and Other (See Comments)    Redness.    Lidocaine Palpitations    Meds: Vit D, lexapro, myrbetriq, ibuprofen  Review of Systems  Constitutional: Negative.   HENT: Negative.    Respiratory: Negative.    Cardiovascular: Negative.   Genitourinary:  Positive for menstrual problem.  Musculoskeletal: Negative.   Skin: Negative.   Neurological: Negative.   Psychiatric/Behavioral: Negative.     Height 5' 2"  (1.575 m), weight 68 kg, last menstrual period 07/30/2021. Physical Exam Constitutional:      Appearance: Normal appearance.  HENT:     Head: Normocephalic and atraumatic.  Cardiovascular:     Rate and Rhythm: Normal rate and regular rhythm.  Pulmonary:     Effort: Pulmonary effort is normal.     Breath sounds: Normal breath sounds.  Abdominal:     General: Bowel sounds are normal.     Palpations: Abdomen is soft.  Genitourinary:    General: Normal vulva.     Rectum: Normal.  Musculoskeletal:        General: Normal range of motion.     Cervical back: Normal range of motion and neck supple.  Skin:    General: Skin is warm and dry.  Neurological:     General: No focal deficit present.     Mental Status: She is alert and oriented to person, place, and time.  Psychiatric:        Behavior: Behavior normal.    [place] nl uterus, persistent L ovarian cyst     Assessment/Plan: 36yo G3P3 with AUB/persistent L ovarian cyst for definitive mgmt with LAVH/LO/cysto D/w pt r/b/a of  surgery and process/expectations Ancef for prophylaxis Will proceed  Charleton Deyoung Bovard-Stuckert 08/08/2021, 7:41 PM

## 2021-08-10 ENCOUNTER — Other Ambulatory Visit: Payer: Self-pay

## 2021-08-10 ENCOUNTER — Encounter (HOSPITAL_COMMUNITY)
Admission: RE | Admit: 2021-08-10 | Discharge: 2021-08-10 | Disposition: A | Discharge status: Home / Self Care | Payer: Medicaid Other | Source: Ambulatory Visit | Attending: Obstetrics and Gynecology | Admitting: Obstetrics and Gynecology

## 2021-08-10 DIAGNOSIS — Z01812 Encounter for preprocedural laboratory examination: Secondary | ICD-10-CM | POA: Diagnosis present

## 2021-08-10 DIAGNOSIS — R102 Pelvic and perineal pain: Secondary | ICD-10-CM

## 2021-08-10 LAB — COMPREHENSIVE METABOLIC PANEL
ALT: 16 U/L (ref 0–44)
AST: 23 U/L (ref 15–41)
Albumin: 4.3 g/dL (ref 3.5–5.0)
Alkaline Phosphatase: 45 U/L (ref 38–126)
Anion gap: 6 (ref 5–15)
BUN: 14 mg/dL (ref 6–20)
CO2: 23 mmol/L (ref 22–32)
Calcium: 9.1 mg/dL (ref 8.9–10.3)
Chloride: 108 mmol/L (ref 98–111)
Creatinine, Ser: 0.67 mg/dL (ref 0.44–1.00)
GFR, Estimated: >60 mL/min (ref >60)
Glucose, Bld: 90 mg/dL (ref 70–99)
Potassium: 4.1 mmol/L (ref 3.5–5.1)
Sodium: 137 mmol/L (ref 135–145)
Total Bilirubin: 0.6 mg/dL (ref 0.3–1.2)
Total Protein: 7.1 g/dL (ref 6.5–8.1)

## 2021-08-10 LAB — CBC
HCT: 43.1 % (ref 36.0–46.0)
Hemoglobin: 13.9 g/dL (ref 12.0–15.0)
MCH: 30.5 pg (ref 26.0–34.0)
MCHC: 32.3 g/dL (ref 30.0–36.0)
MCV: 94.5 fL (ref 80.0–100.0)
Platelets: UNDETERMINED 10*3/uL (ref 150–400)
RBC: 4.56 MIL/uL (ref 3.87–5.11)
RDW: 12.4 % (ref 11.5–15.5)
WBC: 7.7 10*3/uL (ref 4.0–10.5)
nRBC: 0 % (ref 0.0–0.2)

## 2021-08-13 ENCOUNTER — Encounter (HOSPITAL_BASED_OUTPATIENT_CLINIC_OR_DEPARTMENT_OTHER): Payer: Self-pay | Admitting: Obstetrics and Gynecology

## 2021-08-13 ENCOUNTER — Observation Stay (HOSPITAL_BASED_OUTPATIENT_CLINIC_OR_DEPARTMENT_OTHER)
Admission: RE | Admit: 2021-08-13 | Discharge: 2021-08-14 | Disposition: A | Discharge status: Home / Self Care | Payer: Medicaid Other | Source: Ambulatory Visit | Attending: Obstetrics and Gynecology | Admitting: Obstetrics and Gynecology

## 2021-08-13 ENCOUNTER — Encounter (HOSPITAL_BASED_OUTPATIENT_CLINIC_OR_DEPARTMENT_OTHER)
Admission: RE | Disposition: A | Discharge status: Home / Self Care | Payer: Self-pay | Source: Ambulatory Visit | Attending: Obstetrics and Gynecology

## 2021-08-13 ENCOUNTER — Observation Stay (HOSPITAL_BASED_OUTPATIENT_CLINIC_OR_DEPARTMENT_OTHER): Payer: Medicaid Other | Admitting: Anesthesiology

## 2021-08-13 ENCOUNTER — Other Ambulatory Visit: Payer: Self-pay

## 2021-08-13 DIAGNOSIS — N8003 Adenomyosis of the uterus: Secondary | ICD-10-CM | POA: Diagnosis not present

## 2021-08-13 DIAGNOSIS — N939 Abnormal uterine and vaginal bleeding, unspecified: Secondary | ICD-10-CM | POA: Diagnosis present

## 2021-08-13 DIAGNOSIS — N83202 Unspecified ovarian cyst, left side: Secondary | ICD-10-CM | POA: Diagnosis present

## 2021-08-13 DIAGNOSIS — D259 Leiomyoma of uterus, unspecified: Principal | ICD-10-CM | POA: Insufficient documentation

## 2021-08-13 DIAGNOSIS — N83292 Other ovarian cyst, left side: Secondary | ICD-10-CM | POA: Insufficient documentation

## 2021-08-13 DIAGNOSIS — N72 Inflammatory disease of cervix uteri: Secondary | ICD-10-CM | POA: Diagnosis not present

## 2021-08-13 DIAGNOSIS — R102 Pelvic and perineal pain: Secondary | ICD-10-CM | POA: Insufficient documentation

## 2021-08-13 DIAGNOSIS — Z90710 Acquired absence of both cervix and uterus: Secondary | ICD-10-CM | POA: Diagnosis present

## 2021-08-13 DIAGNOSIS — N888 Other specified noninflammatory disorders of cervix uteri: Secondary | ICD-10-CM | POA: Insufficient documentation

## 2021-08-13 DIAGNOSIS — F17210 Nicotine dependence, cigarettes, uncomplicated: Secondary | ICD-10-CM | POA: Diagnosis not present

## 2021-08-13 DIAGNOSIS — Z91040 Latex allergy status: Secondary | ICD-10-CM | POA: Diagnosis not present

## 2021-08-13 HISTORY — DX: Overactive bladder: N32.81

## 2021-08-13 HISTORY — DX: Anxiety disorder, unspecified: F41.9

## 2021-08-13 HISTORY — PX: CYSTOSCOPY: SHX5120

## 2021-08-13 HISTORY — PX: LAPAROSCOPIC VAGINAL HYSTERECTOMY WITH SALPINGECTOMY: SHX6680

## 2021-08-13 LAB — TYPE AND SCREEN
ABO/RH(D): A POS
Antibody Screen: NEGATIVE

## 2021-08-13 LAB — POCT PREGNANCY, URINE: Preg Test, Ur: NEGATIVE

## 2021-08-13 SURGERY — HYSTERECTOMY, VAGINAL, LAPAROSCOPY-ASSISTED, WITH SALPINGECTOMY
Anesthesia: General | Site: Bladder

## 2021-08-13 MED ORDER — OXYCODONE HCL 5 MG/5ML PO SOLN: 5.0000 mg | Freq: Once | ORAL | Status: DC | PRN

## 2021-08-13 MED ORDER — TRAMADOL HCL 50 MG PO TABS
ORAL_TABLET | ORAL | Status: AC
  Filled 2021-08-13: qty 2

## 2021-08-13 MED ORDER — SODIUM CHLORIDE FLUSH 0.9 % IV SOLN
INTRAVENOUS | Status: DC | PRN
Start: 2021-08-13 — End: 2021-08-13
  Administered 2021-08-13: 100 mL

## 2021-08-13 MED ORDER — ALUM & MAG HYDROXIDE-SIMETH 200-200-20 MG/5ML PO SUSP: 30.0000 mL | ORAL | Status: DC | PRN

## 2021-08-13 MED ORDER — IBUPROFEN 800 MG PO TABS
ORAL_TABLET | ORAL | Status: AC
  Filled 2021-08-13: qty 1

## 2021-08-13 MED ORDER — TRAMADOL HCL 50 MG PO TABS
ORAL_TABLET | ORAL | Status: AC
  Filled 2021-08-13: qty 1

## 2021-08-13 MED ORDER — PHENYLEPHRINE 40 MCG/ML (10ML) SYRINGE FOR IV PUSH (FOR BLOOD PRESSURE SUPPORT)
PREFILLED_SYRINGE | INTRAVENOUS | Status: AC
  Filled 2021-08-13: qty 10

## 2021-08-13 MED ORDER — LACTATED RINGERS IV SOLN: INTRAVENOUS | Status: DC

## 2021-08-13 MED ORDER — DIPHENHYDRAMINE HCL 12.5 MG/5ML PO ELIX: 12.5000 mg | ORAL_SOLUTION | Freq: Four times a day (QID) | ORAL | Status: DC | PRN

## 2021-08-13 MED ORDER — MIDAZOLAM HCL 5 MG/5ML IJ SOLN
INTRAMUSCULAR | Status: DC | PRN
  Administered 2021-08-13: 1 mg via INTRAVENOUS
  Administered 2021-08-13: 2 mg via INTRAVENOUS
  Administered 2021-08-13: 1 mg via INTRAVENOUS

## 2021-08-13 MED ORDER — SCOPOLAMINE 1 MG/3DAYS TD PT72
MEDICATED_PATCH | TRANSDERMAL | Status: AC
  Filled 2021-08-13: qty 1

## 2021-08-13 MED ORDER — DEXAMETHASONE SODIUM PHOSPHATE 10 MG/ML IJ SOLN
INTRAMUSCULAR | Status: AC
  Filled 2021-08-13: qty 1

## 2021-08-13 MED ORDER — CEFAZOLIN SODIUM-DEXTROSE 2-4 GM/100ML-% IV SOLN
INTRAVENOUS | Status: AC
  Filled 2021-08-13: qty 100

## 2021-08-13 MED ORDER — TRAMADOL HCL 50 MG PO TABS
50.0000 mg | ORAL_TABLET | Freq: Four times a day (QID) | ORAL | Status: DC | PRN
  Administered 2021-08-13: 50 mg via ORAL
  Administered 2021-08-13: 100 mg via ORAL
  Administered 2021-08-13 – 2021-08-14 (×2): 50 mg via ORAL

## 2021-08-13 MED ORDER — MIDAZOLAM HCL 2 MG/2ML IJ SOLN
INTRAMUSCULAR | Status: AC
Start: 2021-08-13 — End: ?
  Filled 2021-08-13: qty 2

## 2021-08-13 MED ORDER — SCOPOLAMINE 1 MG/3DAYS TD PT72
1.0000 | MEDICATED_PATCH | TRANSDERMAL | Status: DC
  Administered 2021-08-13: 1.5 mg via TRANSDERMAL

## 2021-08-13 MED ORDER — ONDANSETRON HCL 4 MG PO TABS: 4.0000 mg | ORAL_TABLET | Freq: Four times a day (QID) | ORAL | Status: DC | PRN

## 2021-08-13 MED ORDER — MENTHOL 3 MG MT LOZG: 1.0000 | LOZENGE | OROMUCOSAL | Status: DC | PRN

## 2021-08-13 MED ORDER — SODIUM CHLORIDE 0.9 % IR SOLN
Status: DC | PRN
  Administered 2021-08-13: 3000 mL

## 2021-08-13 MED ORDER — OXYCODONE-ACETAMINOPHEN 5-325 MG PO TABS: 1.0000 | ORAL_TABLET | ORAL | Status: DC | PRN

## 2021-08-13 MED ORDER — HYDROMORPHONE HCL 1 MG/ML IJ SOLN
0.2000 mg | INTRAMUSCULAR | Status: DC | PRN
  Administered 2021-08-13 – 2021-08-14 (×3): 0.6 mg via INTRAVENOUS

## 2021-08-13 MED ORDER — PHENYLEPHRINE 40 MCG/ML (10ML) SYRINGE FOR IV PUSH (FOR BLOOD PRESSURE SUPPORT)
PREFILLED_SYRINGE | INTRAVENOUS | Status: DC | PRN
  Administered 2021-08-13 (×2): 80 ug via INTRAVENOUS

## 2021-08-13 MED ORDER — ONDANSETRON HCL 4 MG/2ML IJ SOLN
INTRAMUSCULAR | Status: DC | PRN
  Administered 2021-08-13: 4 mg via INTRAVENOUS

## 2021-08-13 MED ORDER — ESCITALOPRAM OXALATE 20 MG PO TABS
20.0000 mg | ORAL_TABLET | Freq: Every day | ORAL | Status: DC
  Filled 2021-08-13: qty 1

## 2021-08-13 MED ORDER — GABAPENTIN 300 MG PO CAPS
ORAL_CAPSULE | ORAL | Status: AC
  Filled 2021-08-13: qty 1

## 2021-08-13 MED ORDER — PROMETHAZINE HCL 25 MG/ML IJ SOLN: 6.2500 mg | INTRAMUSCULAR | Status: DC | PRN

## 2021-08-13 MED ORDER — PROMETHAZINE HCL 25 MG PO TABS: 12.5000 mg | ORAL_TABLET | Freq: Four times a day (QID) | ORAL | Status: DC | PRN

## 2021-08-13 MED ORDER — KETOROLAC TROMETHAMINE 30 MG/ML IJ SOLN: 30.0000 mg | Freq: Once | INTRAMUSCULAR | Status: DC | PRN

## 2021-08-13 MED ORDER — DEXAMETHASONE SODIUM PHOSPHATE 4 MG/ML IJ SOLN
INTRAMUSCULAR | Status: DC | PRN
  Administered 2021-08-13: 10 mg via INTRAVENOUS

## 2021-08-13 MED ORDER — BUPIVACAINE HCL (PF) 0.25 % IJ SOLN
INTRAMUSCULAR | Status: DC | PRN
  Administered 2021-08-13: 10 mL

## 2021-08-13 MED ORDER — SUGAMMADEX SODIUM 200 MG/2ML IV SOLN
INTRAVENOUS | Status: DC | PRN
  Administered 2021-08-13: 200 mg via INTRAVENOUS

## 2021-08-13 MED ORDER — ONDANSETRON HCL 4 MG/2ML IJ SOLN: 4.0000 mg | Freq: Four times a day (QID) | INTRAMUSCULAR | Status: DC | PRN

## 2021-08-13 MED ORDER — ACETAMINOPHEN 500 MG PO TABS
1000.0000 mg | ORAL_TABLET | ORAL | Status: AC
  Administered 2021-08-13: 1000 mg via ORAL

## 2021-08-13 MED ORDER — EPHEDRINE SULFATE (PRESSORS) 50 MG/ML IJ SOLN
INTRAMUSCULAR | Status: DC | PRN
Start: 2021-08-13 — End: 2021-08-13
  Administered 2021-08-13 (×2): 10 mg via INTRAVENOUS

## 2021-08-13 MED ORDER — HYDROMORPHONE HCL 1 MG/ML IJ SOLN
INTRAMUSCULAR | Status: AC
  Filled 2021-08-13: qty 1

## 2021-08-13 MED ORDER — HYDROMORPHONE HCL 1 MG/ML IJ SOLN: 0.2500 mg | INTRAMUSCULAR | Status: DC | PRN

## 2021-08-13 MED ORDER — DROPERIDOL 2.5 MG/ML IJ SOLN
INTRAMUSCULAR | Status: AC
  Filled 2021-08-13: qty 2

## 2021-08-13 MED ORDER — SODIUM CHLORIDE 0.9% FLUSH: 9.0000 mL | INTRAVENOUS | Status: DC | PRN

## 2021-08-13 MED ORDER — MIDAZOLAM HCL 2 MG/2ML IJ SOLN
INTRAMUSCULAR | Status: AC
  Filled 2021-08-13: qty 2

## 2021-08-13 MED ORDER — POVIDONE-IODINE 10 % EX SWAB: 2.0000 "application " | Freq: Once | CUTANEOUS | Status: DC

## 2021-08-13 MED ORDER — KETOROLAC TROMETHAMINE 30 MG/ML IJ SOLN
INTRAMUSCULAR | Status: AC
  Filled 2021-08-13: qty 1

## 2021-08-13 MED ORDER — ACETAMINOPHEN 500 MG PO TABS
ORAL_TABLET | ORAL | Status: AC
  Filled 2021-08-13: qty 2

## 2021-08-13 MED ORDER — 0.9 % SODIUM CHLORIDE (POUR BTL) OPTIME
TOPICAL | Status: DC | PRN
  Administered 2021-08-13: 1000 mL

## 2021-08-13 MED ORDER — PROPOFOL 10 MG/ML IV BOLUS
INTRAVENOUS | Status: DC | PRN
  Administered 2021-08-13: 150 mg via INTRAVENOUS

## 2021-08-13 MED ORDER — OXYCODONE HCL 5 MG PO TABS: 5.0000 mg | ORAL_TABLET | Freq: Once | ORAL | Status: DC | PRN

## 2021-08-13 MED ORDER — SIMETHICONE 80 MG PO CHEW: 80.0000 mg | CHEWABLE_TABLET | Freq: Four times a day (QID) | ORAL | Status: DC | PRN

## 2021-08-13 MED ORDER — PROPOFOL 10 MG/ML IV BOLUS
INTRAVENOUS | Status: AC
  Filled 2021-08-13: qty 20

## 2021-08-13 MED ORDER — PROMETHAZINE HCL 25 MG/ML IJ SOLN
25.0000 mg | Freq: Four times a day (QID) | INTRAMUSCULAR | Status: DC | PRN
  Administered 2021-08-14: 25 mg via INTRAVENOUS
  Filled 2021-08-13: qty 25
  Filled 2021-08-13: qty 1

## 2021-08-13 MED ORDER — GABAPENTIN 300 MG PO CAPS
300.0000 mg | ORAL_CAPSULE | ORAL | Status: AC
  Administered 2021-08-13: 300 mg via ORAL

## 2021-08-13 MED ORDER — EPHEDRINE 5 MG/ML INJ
INTRAVENOUS | Status: AC
  Filled 2021-08-13: qty 5

## 2021-08-13 MED ORDER — MIRABEGRON ER 25 MG PO TB24
25.0000 mg | ORAL_TABLET | Freq: Every day | ORAL | Status: DC
  Filled 2021-08-13: qty 1

## 2021-08-13 MED ORDER — PROPOFOL 10 MG/ML IV BOLUS
INTRAVENOUS | Status: AC
Start: 2021-08-13 — End: ?
  Filled 2021-08-13: qty 20

## 2021-08-13 MED ORDER — NALOXONE HCL 0.4 MG/ML IJ SOLN: 0.4000 mg | INTRAMUSCULAR | Status: DC | PRN

## 2021-08-13 MED ORDER — ONDANSETRON HCL 4 MG/2ML IJ SOLN
INTRAMUSCULAR | Status: AC
  Filled 2021-08-13: qty 2

## 2021-08-13 MED ORDER — FENTANYL CITRATE (PF) 100 MCG/2ML IJ SOLN
INTRAMUSCULAR | Status: AC
  Filled 2021-08-13: qty 2

## 2021-08-13 MED ORDER — IBUPROFEN 800 MG PO TABS
800.0000 mg | ORAL_TABLET | Freq: Three times a day (TID) | ORAL | Status: DC | PRN
  Administered 2021-08-13 – 2021-08-14 (×2): 800 mg via ORAL

## 2021-08-13 MED ORDER — ROCURONIUM BROMIDE 10 MG/ML (PF) SYRINGE
PREFILLED_SYRINGE | INTRAVENOUS | Status: AC
  Filled 2021-08-13: qty 10

## 2021-08-13 MED ORDER — GUAIFENESIN 100 MG/5ML PO LIQD
15.0000 mL | ORAL | Status: DC | PRN
  Filled 2021-08-13: qty 15

## 2021-08-13 MED ORDER — DIPHENHYDRAMINE HCL 50 MG/ML IJ SOLN: 12.5000 mg | Freq: Four times a day (QID) | INTRAMUSCULAR | Status: DC | PRN

## 2021-08-13 MED ORDER — DROPERIDOL 2.5 MG/ML IJ SOLN
INTRAMUSCULAR | Status: DC | PRN
  Administered 2021-08-13: .625 mg via INTRAVENOUS

## 2021-08-13 MED ORDER — FENTANYL CITRATE (PF) 250 MCG/5ML IJ SOLN
INTRAMUSCULAR | Status: AC
  Filled 2021-08-13: qty 5

## 2021-08-13 MED ORDER — ROCURONIUM BROMIDE 100 MG/10ML IV SOLN
INTRAVENOUS | Status: DC | PRN
Start: 2021-08-13 — End: 2021-08-13
  Administered 2021-08-13: 20 mg via INTRAVENOUS
  Administered 2021-08-13: 80 mg via INTRAVENOUS

## 2021-08-13 MED ORDER — CEFAZOLIN SODIUM-DEXTROSE 2-4 GM/100ML-% IV SOLN
2.0000 g | INTRAVENOUS | Status: AC
  Administered 2021-08-13: 2 g via INTRAVENOUS

## 2021-08-13 MED ORDER — KETOROLAC TROMETHAMINE 30 MG/ML IJ SOLN
INTRAMUSCULAR | Status: DC | PRN
  Administered 2021-08-13: 30 mg via INTRAVENOUS

## 2021-08-13 MED ORDER — SODIUM CHLORIDE 0.9 % IR SOLN
Status: DC | PRN
  Administered 2021-08-13: 1000 mL

## 2021-08-13 MED ORDER — HYDROMORPHONE 1 MG/ML IV SOLN: INTRAVENOUS | Status: DC

## 2021-08-13 MED ORDER — VASOPRESSIN 20 UNIT/ML IV SOLN
INTRAVENOUS | Status: DC | PRN
  Administered 2021-08-13: 20 [IU]

## 2021-08-13 MED ORDER — FENTANYL CITRATE (PF) 100 MCG/2ML IJ SOLN
INTRAMUSCULAR | Status: DC | PRN
  Administered 2021-08-13: 50 ug via INTRAVENOUS
  Administered 2021-08-13: 25 ug via INTRAVENOUS
  Administered 2021-08-13 (×3): 50 ug via INTRAVENOUS
  Administered 2021-08-13: 25 ug via INTRAVENOUS
  Administered 2021-08-13 (×2): 50 ug via INTRAVENOUS

## 2021-08-13 SURGICAL SUPPLY — 67 items
ADH SKN CLS APL DERMABOND .7 (GAUZE/BANDAGES/DRESSINGS) ×3
APL SRG 38 LTWT LNG FL B (MISCELLANEOUS)
APPLICATOR ARISTA FLEXITIP XL (MISCELLANEOUS) IMPLANT
BAG RETRIEVAL 10 (BASKET)
CABLE HIGH FREQUENCY MONO STRZ (ELECTRODE) IMPLANT
CNTNR URN SCR LID CUP LEK RST (MISCELLANEOUS) ×2 IMPLANT
CONT SPEC 4OZ STRL OR WHT (MISCELLANEOUS)
COVER BACK TABLE 60X90IN (DRAPES) ×4 IMPLANT
COVER MAYO STAND STRL (DRAPES) ×8 IMPLANT
DECANTER SPIKE VIAL GLASS SM (MISCELLANEOUS) IMPLANT
DERMABOND ADVANCED (GAUZE/BANDAGES/DRESSINGS) ×1
DERMABOND ADVANCED .7 DNX12 (GAUZE/BANDAGES/DRESSINGS) ×3 IMPLANT
DRSG COVADERM PLUS 2X2 (GAUZE/BANDAGES/DRESSINGS) IMPLANT
DRSG OPSITE POSTOP 3X4 (GAUZE/BANDAGES/DRESSINGS) IMPLANT
DURAPREP 26ML APPLICATOR (WOUND CARE) ×4 IMPLANT
ELECT REM PT RETURN 9FT ADLT (ELECTROSURGICAL) ×4
ELECTRODE REM PT RTRN 9FT ADLT (ELECTROSURGICAL) ×3 IMPLANT
GAUZE 4X4 16PLY ~~LOC~~+RFID DBL (SPONGE) ×12 IMPLANT
GAUZE VASELINE 3X9 (GAUZE/BANDAGES/DRESSINGS) IMPLANT
GLOVE SURG ENC MOIS LTX SZ6.5 (GLOVE) ×6 IMPLANT
GLOVE SURG NEOP MICRO LF SZ6.5 (GLOVE) ×10 IMPLANT
GLOVE SURG NEOPR MICRO LF SZ7 (GLOVE) ×4 IMPLANT
GLOVE SURG UNDER POLY LF SZ6.5 (GLOVE) ×10 IMPLANT
GLOVE SURG UNDER POLY LF SZ7.5 (GLOVE) ×4 IMPLANT
GOWN STRL REUS W/ TWL LRG LVL3 (GOWN DISPOSABLE) ×2 IMPLANT
GOWN STRL REUS W/TWL LRG LVL3 (GOWN DISPOSABLE) ×16 IMPLANT
HEMOSTAT ARISTA ABSORB 3G PWDR (HEMOSTASIS) IMPLANT
HIBICLENS CHG 4% 4OZ BTL (MISCELLANEOUS) ×4 IMPLANT
IV NS IRRIG 3000ML ARTHROMATIC (IV SOLUTION) ×2 IMPLANT
KIT TURNOVER CYSTO (KITS) ×4 IMPLANT
NEEDLE INSUFFLATION 120MM (ENDOMECHANICALS) ×4 IMPLANT
NS IRRIG 1000ML POUR BTL (IV SOLUTION) ×6 IMPLANT
NS IRRIG 500ML POUR BTL (IV SOLUTION) ×2 IMPLANT
PACK LAPAROSCOPY BASIN (CUSTOM PROCEDURE TRAY) ×4 IMPLANT
PACK LAVH (CUSTOM PROCEDURE TRAY) ×4 IMPLANT
PACK ROBOTIC GOWN (GOWN DISPOSABLE) ×6 IMPLANT
PACK TRENDGUARD 450 HYBRID PRO (MISCELLANEOUS) ×3 IMPLANT
PACK WEDGE TRENDGUARD 450 PROC (MISCELLANEOUS) ×3 IMPLANT
PAD OB MATERNITY 4.3X12.25 (PERSONAL CARE ITEMS) ×4 IMPLANT
PROTECTOR NERVE ULNAR (MISCELLANEOUS) ×8 IMPLANT
SET IRRIG Y TYPE TUR BLADDER L (SET/KITS/TRAYS/PACK) ×4 IMPLANT
SET SUCTION IRRIG HYDROSURG (IRRIGATION / IRRIGATOR) ×4 IMPLANT
SET TUBE SMOKE EVAC HIGH FLOW (TUBING) ×4 IMPLANT
SHEARS HARMONIC ACE PLUS 36CM (ENDOMECHANICALS) ×4 IMPLANT
SPONGE T-LAP 4X18 ~~LOC~~+RFID (SPONGE) ×4 IMPLANT
SUT VIC AB 0 CT1 18XCR BRD8 (SUTURE) IMPLANT
SUT VIC AB 0 CT1 8-18 (SUTURE)
SUT VIC AB 1 CT1 18XBRD ANBCTR (SUTURE) ×6 IMPLANT
SUT VIC AB 1 CT1 8-18 (SUTURE) ×8
SUT VIC AB 2-0 CT1 (SUTURE) ×6 IMPLANT
SUT VIC AB 2-0 CT2 27 (SUTURE) IMPLANT
SUT VIC AB 3-0 SH 27 (SUTURE)
SUT VIC AB 3-0 SH 27X BRD (SUTURE) IMPLANT
SUT VIC AB 4-0 PS2 18 (SUTURE) ×4 IMPLANT
SUT VIC AB 4-0 PS2 27 (SUTURE) ×4 IMPLANT
SUT VICRYL 0 TIES 12 18 (SUTURE) ×4 IMPLANT
SUT VICRYL 0 UR6 27IN ABS (SUTURE) IMPLANT
SYS BAG RETRIEVAL 10MM (BASKET)
SYSTEM BAG RETRIEVAL 10MM (BASKET) IMPLANT
TOWEL OR 17X26 10 PK STRL BLUE (TOWEL DISPOSABLE) ×4 IMPLANT
TRAY FOLEY W/BAG SLVR 14FR LF (SET/KITS/TRAYS/PACK) ×4 IMPLANT
TRENDGUARD 450 HYBRID PRO PACK (MISCELLANEOUS) ×4
TRENDGUARD 450 WEDGE PROC PACK (MISCELLANEOUS) ×4
TROCAR BALLN 12MMX100 BLUNT (TROCAR) IMPLANT
TROCAR BLADELESS OPT 5 100 (ENDOMECHANICALS) ×12 IMPLANT
TROCAR XCEL NON-BLD 11X100MML (ENDOMECHANICALS) IMPLANT
WARMER LAPAROSCOPE (MISCELLANEOUS) ×4 IMPLANT

## 2021-08-13 NOTE — Progress Notes (Signed)
Day of Surgery Procedure(s) (LRB): LAPAROSCOPIC ASSISTED VAGINAL HYSTERECTOMY WITH LEFT OOPHORECTOMY (Left) CYSTOSCOPY (N/A)  Subjective: Patient reports incisional pain, tolerating PO, and no problems voiding.  Pain controlled.    Objective: I have reviewed patient's vital signs, intake and output, medications, and labs.  General: alert and no distress Resp: clear to auscultation bilaterally Cardio: regular rate and rhythm GI: soft, non-tender; bowel sounds normal; no masses,  no organomegaly Extremities: extremities normal, atraumatic, no cyanosis or edema Vaginal Bleeding: minimal  Assessment: s/p Procedure(s): LAPAROSCOPIC ASSISTED VAGINAL HYSTERECTOMY WITH LEFT OOPHORECTOMY (Left) CYSTOSCOPY (N/A): stable and progressing well  Plan: Encourage ambulation, Continue po pain control.  D/C in AM - with tramadol and ibuprofen.  F/U 2 and 6 weeks  LOS: 0 days    Srishti Strnad Bovard-Stuckert 08/13/2021, 4:23 PM

## 2021-08-13 NOTE — Op Note (Signed)
Laurie Warren, Laurie Warren MEDICAL RECORD NO: [id number] ACCOUNT NO: [id number] DATE OF BIRTH: 09/22/1985 FACILITY: [facility] LOCATION: [facility] PHYSICIAN: Janyth Contes, MD  Operative Report   DATE OF PROCEDURE: 08/13/2021  PREOPERATIVE DIAGNOSIS:  Pain in pelvis, persistent left ovarian cyst.  POSTOPERATIVE DIAGNOSIS:  Pain in pelvis, persistent left ovarian cyst.  PROCEDURE:  Laparoscopic-assisted vaginal hysterectomy with a left oophorectomy and cystoscopy.  SURGEON:  Janyth Contes, MD  ASSISTANT:  Eula Flax, MD  ANESTHESIA:  Local and general.  ESTIMATED BLOOD LOSS:  150 mL.  URINE OUTPUT AND IV FLUIDS:  Per Anesthesia.  Clear urine at the end of the procedure.  COMPLICATIONS:  None.  PATHOLOGY:  Left ovary, uterus and cervix.  DESCRIPTION OF PROCEDURE:  After informed consent was reviewed with the patient including risks, benefits and alternatives of the surgical procedure she was transported to the OR, placed on the table in supine position and then placed in the Yellofin  stirrups when anesthesia was adequate.  After an appropriate timeout was performed she was prepped and draped in the normal sterile fashion using an open sided speculum and a single tooth tenaculum a Hulka manipulator was placed in her uterus.  Gloves  and gown were changed.  Attention was turned to the abdominal portion of the case, approximately 5 mm infraumbilical incision was made using Veress needle.  Peritoneum was entered.  Entry into the peritoneum was confirmed with a hanging drop test and an  opening pressure of 0 mmHg.  Pneumoperitoneum was obtained.  Accessory ports were placed on both the right and left under direct visualization.  A normal appendix was noted as well as a somewhat boggy appearing uterus, an enlarged left ovary and a normal  right ovary.  Her tubes have been previously surgically removed.  There was minimal scarring from her cesarean sections.  Her left ovary was grasped  with an atraumatic grasper and using Harmonic scalpel it was excised and placed in the posterior  cul-de-sac.  The uterus was grasped at the cornu on the left and in a separate fashion the round ligament was ligated and the cardinal ligament wee pedicalized to the level of the bladder flap.  The bladder flap was created to the midline.  Attention was  then turned to the left side, which was done in a similar fashion, the left cornu was grasped and the uterus was elevated.  The round ligament was ligated.  The cardinal ligaments were pedicalized to the level of the bladder flap.  The bladder flap was  met with the bladder flap from the left.  Attention was then turned to the vagina and the vagina was injected with 20 mL of 1:100 vasopressin.  Cervix was circumscribed with Bovie cautery.  The anterior cul-de-sac was entered sharply.  The posterior  cul-de-sac was also entered sharply.  The uterosacral ligaments were ligated and held bilaterally in a stepwise fashion.  Other pedicles were made to the level of the uterine artery.  The uterine artery was ligated bilaterally.  The uterus was removed  through the vagina.  The pedicles were inspected and found to be hemostatic.  Uterosacral ligaments were plicated and then the held sutures were tied together.  The vaginal cuff was closed with 2-0 Vicryl.  The cystoscopy was performed revealing jets  from bilateral ureteral orifices.  The gloves and gowns were changed.  Attention was turned back to the abdominal portion of the case.  The pedicles were again noted to be hemostatic.  The left and  right ports were removed under direct visualization.   The pneumoperitoneum was evacuated.  The umbilical trocar was also removed.  The trocar sites were closed with 4-0 Vicryl in a subcuticular fashion and Dermabond was applied.  The patient was returned to the supine position.  She tolerated the procedure  well.  Sponge, lap and needle counts were correct x2 per the  operating staff.   PUS D: 08/13/2021 9:59:53 am T: 08/13/2021 11:42:00 am  JOB: [id number]/ [id number]

## 2021-08-13 NOTE — Transfer of Care (Signed)
Immediate Anesthesia Transfer of Care Note  Patient: Laurie Warren  Procedure(s) Performed: Procedure(s) (LRB): LAPAROSCOPIC ASSISTED VAGINAL HYSTERECTOMY WITH LEFT OOPHORECTOMY (Left) CYSTOSCOPY (N/A)  Patient Location: PACU  Anesthesia Type: General  Level of Consciousness: awake, sedated, patient cooperative and responds to stimulation  Airway & Oxygen Therapy: Patient Spontanous Breathing and Patient connected to  02 and soft FM   Post-op Assessment: Report given to PACU RN, Post -op Vital signs reviewed and stable and Patient moving all extremities  Post vital signs: Reviewed and stable  Complications: No apparent anesthesia complications

## 2021-08-13 NOTE — Anesthesia Preprocedure Evaluation (Signed)
Anesthesia Evaluation  Patient identified by MRN, date of birth, ID band Patient awake    Reviewed: Allergy & Precautions, NPO status , Patient's Chart, lab work & pertinent test results  History of Anesthesia Complications (+) PONV and history of anesthetic complications  Airway Mallampati: II  TM Distance: >3 FB Neck ROM: Full    Dental no notable dental hx.    Pulmonary neg pulmonary ROS, Current Smoker,    Pulmonary exam normal breath sounds clear to auscultation       Cardiovascular negative cardio ROS Normal cardiovascular exam Rhythm:Regular Rate:Normal     Neuro/Psych negative neurological ROS  negative psych ROS   GI/Hepatic negative GI ROS, Neg liver ROS,   Endo/Other  negative endocrine ROS  Renal/GU negative Renal ROS  negative genitourinary   Musculoskeletal negative musculoskeletal ROS (+)   Abdominal   Peds negative pediatric ROS (+)  Hematology negative hematology ROS (+)   Anesthesia Other Findings   Reproductive/Obstetrics negative OB ROS                             Anesthesia Physical Anesthesia Plan  ASA: 1  Anesthesia Plan: General   Post-op Pain Management: Dilaudid IV   Induction: Intravenous  PONV Risk Score and Plan: 3 and Ondansetron, Dexamethasone, Midazolam, Scopolamine patch - Pre-op, Treatment may vary due to age or medical condition and Droperidol  Airway Management Planned: Oral ETT  Additional Equipment:   Intra-op Plan:   Post-operative Plan: Extubation in OR  Informed Consent: I have reviewed the patients History and Physical, chart, labs and discussed the procedure including the risks, benefits and alternatives for the proposed anesthesia with the patient or authorized representative who has indicated his/her understanding and acceptance.     Dental advisory given  Plan Discussed with: CRNA and Surgeon  Anesthesia Plan Comments:          Anesthesia Quick Evaluation

## 2021-08-13 NOTE — Anesthesia Procedure Notes (Signed)
Procedure Name: Intubation Date/Time: 08/13/2021 7:37 AM Performed by: Justice Rocher, CRNA Pre-anesthesia Checklist: Patient identified, Emergency Drugs available, Suction available, Patient being monitored and Timeout performed Patient Re-evaluated:Patient Re-evaluated prior to induction Oxygen Delivery Method: Circle system utilized Preoxygenation: Pre-oxygenation with 100% oxygen Induction Type: IV induction Ventilation: Mask ventilation without difficulty Laryngoscope Size: Mac and 3 Grade View: Grade II Tube type: Oral Tube size: 7.0 mm Number of attempts: 1 Airway Equipment and Method: Stylet and Oral airway Placement Confirmation: ETT inserted through vocal cords under direct vision, positive ETCO2, breath sounds checked- equal and bilateral and CO2 detector Secured at: 22 cm Tube secured with: Tape Dental Injury: Teeth and Oropharynx as per pre-operative assessment

## 2021-08-13 NOTE — Interval H&P Note (Signed)
History and Physical Interval Note:  08/13/2021 7:10 AM  Laurie Warren  has presented today for surgery, with the diagnosis of pain in pelvis.  The various methods of treatment have been discussed with the patient and family. After consideration of risks, benefits and other options for treatment, the patient has consented to  Procedure(s): LAPAROSCOPIC ASSISTED VAGINAL HYSTERECTOMY WITH SALPINGECTOMY (Bilateral) POSSIBLE CYSTOSCOPY (N/A) LAPAROSCOPIC LEFT SALPINGO OOPHORECTOMY (Left) as a surgical intervention.  The patient's history has been reviewed, patient examined, no change in status, stable for surgery.  I have reviewed the patient's chart and labs.  Questions were answered to the patient's satisfaction.     [name]

## 2021-08-13 NOTE — Discharge Summary (Incomplete)
Physician Discharge Summary  Patient ID: Laurie Warren MRN: [id number] DOB/AGE: 08-25-1985 36 y.o.  Admit date: 08/13/2021 Discharge date: 08/13/2021  Admission Diagnoses:  Discharge Diagnoses:  Principal Problem:   Abnormal uterine bleeding (AUB) Active Problems:   Pelvic pain   S/P laparoscopic assisted vaginal hysterectomy (LAVH)   Discharged Condition: good  Hospital Course: Admitted to [facility] for LAVH/L oopherectomy/cysto 2/16, underwent surgery without complication.  Discharge to home POD#1 - ambulating, voiding, tolerating po, pain controlled.  Labs stable.    Consults: None  Significant Diagnostic Studies: labs: CBC, BMP  Treatments: IV hydration, analgesia: toradol, tramadol, and surgery: LAVH/L oopherectomy/ cysto  Discharge Exam: Blood pressure (!) 90/57, pulse 76, temperature 97.9 F (36.6 C), resp. rate 14, height 5\' 2"  (1.575 m), weight 67.6 kg, last menstrual period 07/30/2021, SpO2 99 %. General appearance: alert and no distress Resp: clear to auscultation bilaterally Cardio: regular rate and rhythm GI: soft, non-tender; bowel sounds normal; no masses,  no organomegaly Extremities: extremities normal, atraumatic, no cyanosis or edema Incision/Wound: C/D/I  Disposition:    Allergies as of 08/13/2021       Reactions   Sulfa Antibiotics Anaphylaxis   Latex Swelling, Other (See Comments)   Redness.    Lidocaine Palpitations     Med Rec must be completed prior to using this [place]***       Follow-up Information     Bovard-Stuckert, Delano Scardino, MD. Schedule an appointment as soon as possible for a visit.   Specialty: Obstetrics and Gynecology Why: 2 weeks and 6 weeks for postop check Contact information: [address] [place] [address] [phone]                 Signed: Janyth Contes 08/13/2021, 4:18 PM

## 2021-08-13 NOTE — Brief Op Note (Signed)
08/13/2021  9:52 AM  PATIENT:  Laurie Warren  36 y.o. female  PRE-OPERATIVE DIAGNOSIS:  pain in pelvis, persistent L ovarian cyst  POST-OPERATIVE DIAGNOSIS:  pain in pelvis, persistent L ovarian cyst  PROCEDURE:  Procedure(s): LAPAROSCOPIC ASSISTED VAGINAL HYSTERECTOMY WITH LEFT OOPHORECTOMY (Left) CYSTOSCOPY (N/A)  SURGEON:  Surgeon(s) and Role:    * Bovard-Stuckert, Jenia Klepper, MD - Primary    * Shivaji, Melida Quitter, MD - Assisting  ANESTHESIA:   local and general  EBL:  150 mL IVF and uop per anesthesia, clear urin eat end of procedure  BLOOD ADMINISTERED:none  DRAINS: Urinary Catheter (Foley)   LOCAL MEDICATIONS USED:  MARCAINE    and OTHER vasopressin 1/100  SPECIMEN:  Source of Specimen:  L ovary, uterus and cervix  DISPOSITION OF SPECIMEN:  PATHOLOGY  COUNTS:  YES  TOURNIQUET:  * No tourniquets in log *  DICTATION: .Other Dictation: Dictation Number [id number]  PLAN OF CARE: Admit for overnight observation  PATIENT DISPOSITION:  PACU - hemodynamically stable.   Delay start of Pharmacological VTE agent (>24hrs) due to surgical blood loss or risk of bleeding: not applicable

## 2021-08-13 NOTE — Anesthesia Postprocedure Evaluation (Signed)
Anesthesia Post Note  Patient: [name]  Procedure(s) Performed: LAPAROSCOPIC ASSISTED VAGINAL HYSTERECTOMY WITH LEFT OOPHORECTOMY (Left: Abdomen) CYSTOSCOPY (Bladder)     Patient location during evaluation: PACU Anesthesia Type: General Level of consciousness: awake and alert Pain management: pain level controlled Vital Signs Assessment: post-procedure vital signs reviewed and stable Respiratory status: spontaneous breathing, nonlabored ventilation, respiratory function stable and patient connected to nasal cannula oxygen Cardiovascular status: blood pressure returned to baseline and stable Postop Assessment: no apparent nausea or vomiting Anesthetic complications: no   No notable events documented.  Last Vitals:  Vitals:   08/13/21 1115 08/13/21 1127  BP: 91/61   Pulse: 84   Resp: 11   Temp: 36.4 C   SpO2: (!) 89% 94%    Last Pain:  Vitals:   08/13/21 1115  TempSrc:   PainSc: 8                  Carmine Youngberg S

## 2021-08-13 NOTE — Progress Notes (Signed)
Pt's hysterectomy is for dysmenorrhea and pelvic pain - she does not have dysmenorrhea.

## 2021-08-13 NOTE — Interval H&P Note (Signed)
History and Physical Interval Note:  08/13/2021 7:09 AM  Laurie Warren  has presented today for surgery, with the diagnosis of pain in pelvis.  The various methods of treatment have been discussed with the patient and family. After consideration of risks, benefits and other options for treatment, the patient has consented to  Procedure(s): LAPAROSCOPIC ASSISTED VAGINAL HYSTERECTOMY WITH SALPINGECTOMY (Bilateral) POSSIBLE CYSTOSCOPY (N/A) LAPAROSCOPIC LEFT SALPINGO OOPHORECTOMY (Left) as a surgical intervention.  The patient's history has been reviewed, patient examined, no change in status, stable for surgery.  I have reviewed the patient's chart and labs.  Questions were answered to the patient's satisfaction.     Ephraim Reichel Bovard-Stuckert

## 2021-08-14 ENCOUNTER — Encounter (HOSPITAL_BASED_OUTPATIENT_CLINIC_OR_DEPARTMENT_OTHER): Payer: Self-pay | Admitting: Obstetrics and Gynecology

## 2021-08-14 DIAGNOSIS — D259 Leiomyoma of uterus, unspecified: Secondary | ICD-10-CM | POA: Diagnosis not present

## 2021-08-14 LAB — BASIC METABOLIC PANEL
Anion gap: 5 (ref 5–15)
BUN: 8 mg/dL (ref 6–20)
CO2: 25 mmol/L (ref 22–32)
Calcium: 8.6 mg/dL — ABNORMAL LOW (ref 8.9–10.3)
Chloride: 107 mmol/L (ref 98–111)
Creatinine, Ser: 0.63 mg/dL (ref 0.44–1.00)
GFR, Estimated: >60 mL/min (ref >60)
Glucose, Bld: 95 mg/dL (ref 70–99)
Potassium: 3.9 mmol/L (ref 3.5–5.1)
Sodium: 137 mmol/L (ref 135–145)

## 2021-08-14 LAB — CBC
HCT: 37.2 % (ref 36.0–46.0)
Hemoglobin: 12.3 g/dL (ref 12.0–15.0)
MCH: 31.1 pg (ref 26.0–34.0)
MCHC: 33.1 g/dL (ref 30.0–36.0)
MCV: 93.9 fL (ref 80.0–100.0)
Platelets: 206 10*3/uL (ref 150–400)
RBC: 3.96 MIL/uL (ref 3.87–5.11)
RDW: 12.3 % (ref 11.5–15.5)
WBC: 14.9 10*3/uL — ABNORMAL HIGH (ref 4.0–10.5)
nRBC: 0 % (ref 0.0–0.2)

## 2021-08-14 LAB — SURGICAL PATHOLOGY

## 2021-08-14 MED ORDER — TRAMADOL HCL 50 MG PO TABS: 50.0000 mg | ORAL_TABLET | Freq: Four times a day (QID) | ORAL | 0 refills | Status: AC | PRN

## 2021-08-14 MED ORDER — HYDROMORPHONE HCL 1 MG/ML IJ SOLN
INTRAMUSCULAR | Status: AC
  Filled 2021-08-14: qty 1

## 2021-08-14 MED ORDER — TRAMADOL HCL 50 MG PO TABS
ORAL_TABLET | ORAL | Status: AC
Start: 2021-08-14 — End: ?
  Filled 2021-08-14: qty 2

## 2021-08-14 MED ORDER — IBUPROFEN 800 MG PO TABS
ORAL_TABLET | ORAL | Status: AC
  Filled 2021-08-14: qty 1

## 2021-08-14 MED ORDER — PROMETHAZINE HCL 12.5 MG PO TABS: 12.5000 mg | ORAL_TABLET | Freq: Four times a day (QID) | ORAL | 0 refills | Status: AC | PRN

## 2021-08-14 MED ORDER — IBUPROFEN 800 MG PO TABS: 800.0000 mg | ORAL_TABLET | Freq: Three times a day (TID) | ORAL | 1 refills | Status: AC | PRN

## 2021-08-14 NOTE — Progress Notes (Signed)
1 Day Post-Op Procedure(s) (LRB): LAPAROSCOPIC ASSISTED VAGINAL HYSTERECTOMY WITH LEFT OOPHORECTOMY (Left) CYSTOSCOPY (N/A)  Subjective: Patient reports nausea, vomiting, incisional pain, tolerating PO, and no problems voiding.  Pt states doesn't tolerate narcotic pain meds well, feels that is source of nausea  Objective: I have reviewed patient's vital signs, intake and output, medications, and labs.  General: alert and no distress Resp: clear to auscultation bilaterally Cardio: regular rate and rhythm GI: soft, non-tender; bowel sounds normal; no masses,  no organomegaly Extremities: extremities normal, atraumatic, no cyanosis or edema Inc: C/D/I  Assessment: s/p Procedure(s): LAPAROSCOPIC ASSISTED VAGINAL HYSTERECTOMY WITH LEFT OOPHORECTOMY (Left) CYSTOSCOPY (N/A): stable and progressing well  Plan: Encourage ambulation.  D/C with tramadol, motrin and percocet.  F/u 2 and 6 weeks  LOS: 0 days    Myangel Summons Bovard-Stuckert 08/14/2021, 7:13 AM

## 2021-08-14 NOTE — Discharge Summary (Signed)
Physician Discharge Summary  Patient ID: Laurie Warren MRN: [id number] DOB/AGE: May 20, 1986 36 y.o.  Admit date: 08/13/2021 Discharge date: 08/14/2021  Admission Diagnoses:  Discharge Diagnoses:  Principal Problem:   Abnormal uterine bleeding (AUB) Active Problems:   Pelvic pain   S/P laparoscopic assisted vaginal hysterectomy (LAVH)   Discharged Condition: good  Hospital Course: admitted for LAVH/LO/cysto/  Post op course uncomplicated except nausea w pain meds.  D/C with motrin, tramadol and phenergan.  Labs stable.  Ambulating, voiding, tolerating po, pain controlled  Consults: None  Significant Diagnostic Studies: labs: CBC, BMP  Treatments: surgery: LAVH/LO/cysto  Discharge Exam: Blood pressure 100/64, pulse 79, temperature 98.3 F (36.8 C), temperature source Oral, resp. rate 16, height 5\' 2"  (1.575 m), weight 67.6 kg, last menstrual period 07/30/2021, SpO2 97 %. General appearance: alert and no distress Resp: clear to auscultation bilaterally Cardio: regular rate and rhythm GI: soft, non-tender; bowel sounds normal; no masses,  no organomegaly Inc C/D/I  Disposition: Discharge disposition: 01-Home or Self Care       Discharge Instructions     Call MD for:  persistant nausea and vomiting   Complete by: As directed    Call MD for:  redness, tenderness, or signs of infection (pain, swelling, redness, odor or green/yellow discharge around incision site)   Complete by: As directed    Call MD for:  severe uncontrolled pain   Complete by: As directed    Diet - low sodium heart healthy   Complete by: As directed    Diet - low sodium heart healthy   Complete by: As directed    Diet - low sodium heart healthy   Complete by: As directed    Discharge instructions   Complete by: As directed    Call [phone] with questions or problems, or Dr Melba Coon [phone])   Driving Restrictions   Complete by: As directed    While taking strong pain medicines   Increase  activity slowly   Complete by: As directed    Increase activity slowly   Complete by: As directed    Increase activity slowly   Complete by: As directed    Lifting restrictions   Complete by: As directed    No greater than 10-15lbs for 6 weeks   May shower / Bathe   Complete by: As directed    May walk up steps   Complete by: As directed    No wound care   Complete by: As directed    Glue (dermabond) may peel.  You may see suture material at incisions   No wound care   Complete by: As directed    No wound care   Complete by: As directed    Sexual Activity Restrictions   Complete by: As directed    Pelvic rest - no douching, tampons or sex for 6 weeks      Allergies as of 08/14/2021       Reactions   Sulfa Antibiotics Anaphylaxis   Latex Swelling, Other (See Comments)   Redness.    Lidocaine Palpitations        Medication List     TAKE these medications    AZO-CRANBERRY PO Take by mouth daily.   calcium carbonate 500 MG chewable tablet Commonly known as: TUMS - dosed in mg elemental calcium Chew 1 tablet by mouth as needed for indigestion or heartburn.   cholecalciferol 25 MCG (1000 UNIT) tablet Commonly known as: VITAMIN D3 Take 1,000 Units by mouth daily. Pt unsure  of mg.   escitalopram 10 MG tablet Commonly known as: LEXAPRO Take 20 mg by mouth daily.   Fish Oil 1000 MG Caps Take by mouth. Pt unsure of mg.   fluconazole 150 MG tablet Commonly known as: Diflucan Take 1 tablet (150 mg total) by mouth daily.   fluticasone 50 MCG/ACT nasal spray Commonly known as: FLONASE Place into both nostrils daily as needed. Uses mostly in summer   ibuprofen 800 MG tablet Commonly known as: ADVIL Take 1 tablet (800 mg total) by mouth every 8 (eight) hours as needed for moderate pain. What changed: when to take this   mirabegron ER 25 MG Tb24 tablet Commonly known as: Myrbetriq Take 1 tablet (25 mg total) by mouth daily.   multivitamin tablet Take 1  tablet by mouth daily.   promethazine 12.5 MG tablet Commonly known as: PHENERGAN Take 1 tablet (12.5 mg total) by mouth every 6 (six) hours as needed for nausea or vomiting. Notes to patient: Can take next dose when you get home   promethazine 12.5 MG tablet Commonly known as: PHENERGAN Take 1-2 tablets (12.5-25 mg total) by mouth every 6 (six) hours as needed for nausea.   traMADol 50 MG tablet Commonly known as: ULTRAM Take 1-2 tablets (50-100 mg total) by mouth every 6 (six) hours as needed for moderate pain. Notes to patient: Next dose is due at 917 am as needed for pain        Follow-up Information     Bovard-Stuckert, Tametha Banning, MD. Schedule an appointment as soon as possible for a visit.   Specialty: Obstetrics and Gynecology Why: 2 weeks and 6 weeks for postop check Contact information: [address] [place] [address] [phone]                 Signed: Janyth Contes 08/14/2021, 8:05 AM

## 2021-08-24 ENCOUNTER — Telehealth: Payer: Self-pay

## 2021-08-24 DIAGNOSIS — N3281 Overactive bladder: Secondary | ICD-10-CM

## 2021-08-24 NOTE — Telephone Encounter (Signed)
Patient left a message on Thurs. calling about myrbetriq 25mg     Thanks, Helene Kelp

## 2021-08-24 NOTE — Telephone Encounter (Signed)
Received denial of myrbetriq - must fail two preferred drugs before approval.  Message sent to MD

## 2021-08-24 NOTE — Telephone Encounter (Signed)
Spoke with patient- samples left at desk. PA started on Covermymeds.

## 2021-08-28 MED ORDER — TOLTERODINE TARTRATE ER 4 MG PO CP24: 4.0000 mg | ORAL_CAPSULE | Freq: Every day | ORAL | 1 refills | Status: DC

## 2021-08-28 NOTE — Telephone Encounter (Signed)
Patient called and notified that insurance will not approve myrbetriq until tried and failed 2 alternatives.  ? ?Detrol LA sent to pharmacy for patient to try detrol.  ? ?Patient voiced understanding  ?

## 2021-09-01 ENCOUNTER — Telehealth: Payer: Self-pay

## 2021-09-01 NOTE — Telephone Encounter (Signed)
Message sent to MD to have medication change due to insurance not approving.  ?

## 2021-09-02 MED ORDER — OXYBUTYNIN CHLORIDE 5 MG PO TABS: 5.0000 mg | ORAL_TABLET | Freq: Three times a day (TID) | ORAL | 0 refills | Status: AC

## 2021-09-02 NOTE — Telephone Encounter (Signed)
Prescription sent to pharmacy.

## 2021-09-09 ENCOUNTER — Ambulatory Visit: Payer: Medicaid Other | Admitting: Urology

## 2021-09-28 ENCOUNTER — Other Ambulatory Visit: Payer: Self-pay

## 2021-09-28 MED ORDER — MIRABEGRON ER 25 MG PO TB24: 25.0000 mg | ORAL_TABLET | Freq: Every day | ORAL | 11 refills | Status: DC

## 2021-10-13 ENCOUNTER — Ambulatory Visit: Payer: Medicaid Other | Admitting: Urology

## 2021-10-21 ENCOUNTER — Telehealth: Payer: Self-pay

## 2021-10-21 NOTE — Telephone Encounter (Signed)
Patient called requesting P.A. for her myrbetriq rx as well as more samples.  Informed her the prior auth had been done we are just waiting to see if rx will be approved or denied.  Samples left at front desk for pt to pick up.  Pt aware. ?

## 2021-11-06 ENCOUNTER — Ambulatory Visit (INDEPENDENT_AMBULATORY_CARE_PROVIDER_SITE_OTHER): Payer: Medicaid Other | Admitting: Urology

## 2021-11-06 ENCOUNTER — Encounter: Payer: Self-pay | Admitting: Urology

## 2021-11-06 VITALS — BP 99/66 | HR 99 | Ht 62.0 in | Wt 144.0 lb

## 2021-11-06 DIAGNOSIS — N3281 Overactive bladder: Secondary | ICD-10-CM | POA: Diagnosis not present

## 2021-11-06 DIAGNOSIS — Z87440 Personal history of urinary (tract) infections: Secondary | ICD-10-CM

## 2021-11-06 DIAGNOSIS — N390 Urinary tract infection, site not specified: Secondary | ICD-10-CM

## 2021-11-06 LAB — MICROSCOPIC EXAMINATION
Bacteria, UA: NONE SEEN
Renal Epithel, UA: NONE SEEN /hpf
WBC, UA: NONE SEEN /hpf (ref 0–5)

## 2021-11-06 LAB — URINALYSIS, ROUTINE W REFLEX MICROSCOPIC
Bilirubin, UA: NEGATIVE
Glucose, UA: NEGATIVE
Ketones, UA: NEGATIVE
Leukocytes,UA: NEGATIVE
Nitrite, UA: NEGATIVE
Protein,UA: NEGATIVE
Specific Gravity, UA: 1.025 (ref 1.005–1.030)
Urobilinogen, Ur: 0.2 mg/dL (ref 0.2–1.0)
pH, UA: 6 (ref 5.0–7.5)

## 2021-11-06 MED ORDER — MIRABEGRON ER 25 MG PO TB24: 25.0000 mg | ORAL_TABLET | Freq: Every day | ORAL | 11 refills | Status: AC

## 2021-11-06 NOTE — Patient Instructions (Signed)

## 2021-11-06 NOTE — Progress Notes (Signed)
11/06/2021 8:52 AM   Ok Laurie Warren [birth year] [id number]  Referring provider: Shari Prows. [name],  [place] [address]  Followup OAB   HPI: [name] is a 36yo here for followup for OAB. She is doing well on mirabegron 53m. Here urinary urgency, frequency and nocturia have resolved. Urine stream is strong. No straining to urinate. No urinary hesitancy. No other complaints today.     PMH: Past Medical History:  Diagnosis Date   Abnormal uterine bleeding (AUB) 08/08/2021   ADD (attention deficit disorder)    mostly as a child   Anxiety    Family history of colon cancer    Family history of gene mutation    RAD51D - paternal side   Family history of kidney cancer    Family history of malignant neoplasm of ovary    Family history of throat cancer    Frequent UTI 2021   GERD (gastroesophageal reflux disease)    mostly while pregnant   Gestational diabetes    with all 3 pregnancies   History of cervical dysplasia    History of gestational diabetes    Menorrhagia    Ovarian cyst, left 08/08/2021   Overactive bladder    PONV (postoperative nausea and vomiting)    PPH (postpartum hemorrhage) 06/10/2017   S/P cervical polypectomy 05/29/2020   S/P endometrial ablation 05/29/2020   Status post bilateral salpingectomy 05/29/2020   Wears contact lenses     Surgical History: Past Surgical History:  Procedure Laterality Date   CESAREAN SECTION  03/2007   CESAREAN SECTION N/A 06/10/2017   Procedure: REPEAT CESAREAN SECTION;  Surgeon: BJanyth Contes MD;  [facility]  Service: Obstetrics;  Laterality: N/A;  HNira ConnK RNFA   CESAREAN SECTION N/A 10/23/2019   Procedure: CESAREAN SECTION;  Surgeon: BJanyth Contes MD;  [facility];  Service: Obstetrics;  Laterality: N/A;   CYSTOSCOPY N/A 08/13/2021   Procedure: CYSTOSCOPY;  Surgeon: BJanyth Contes MD;  [facility] [facility]  Service: Gynecology;  Laterality:  N/A;   DILATION AND CURETTAGE OF UTERUS N/A 06/10/2017   Procedure: DILATATION AND CURETTAGE;  Surgeon: BJanyth Contes MD;  [place] [facility]  Service: Gynecology;  Laterality: N/A;   DILITATION & CURRETTAGE/HYSTROSCOPY WITH NOVASURE ABLATION N/A 05/29/2020   Procedure: DILATATION & CURETTAGE/HYSTEROSCOPY WITH NOVASURE ABLATION, CERVICAL LACERATION REPAIR;  Surgeon: BJanyth Contes MD;  [facility]  Service: Gynecology;  Laterality: N/A;   IRRIGATION AND DEBRIDEMENT ABSCESS N/A 02/19/2016   Procedure: IRRIGATION AND DEBRIDEMENT LEFT LABIAL ABSCESS;  Surgeon: DCoralie Keens MD;  [facility];  Service: General;  Laterality: N/A;   LAPAROSCOPIC BILATERAL SALPINGECTOMY Bilateral 05/29/2020   Procedure: LAPAROSCOPIC BILATERAL SALPINGECTOMY;  Surgeon: BJanyth Contes MD;  [facility]  Service: Gynecology;  Laterality: Bilateral;   LAPAROSCOPIC CHOLECYSTECTOMY  2010   LAPAROSCOPIC VAGINAL HYSTERECTOMY WITH SALPINGECTOMY Left 08/13/2021   Procedure: LAPAROSCOPIC ASSISTED VAGINAL HYSTERECTOMY WITH LEFT OOPHORECTOMY;  Surgeon: BJanyth Contes MD;  [facility]  Service: Gynecology;  Laterality: Left;   TONSILLECTOMY  167months old   WISDOM TOOTH EXTRACTION      Home Medications:  Allergies as of 11/06/2021       Reactions   Sulfa Antibiotics Anaphylaxis   Latex Swelling, Other (See Comments)   Redness.    Lidocaine Palpitations        Medication List        Accurate as of Nov 06, 2021  8:52 AM. If you have any questions, ask your nurse or doctor.          AZO-CRANBERRY PO Take by mouth daily.   calcium carbonate 500 MG chewable tablet Commonly known as: TUMS - dosed in mg elemental calcium Chew 1 tablet by mouth as needed for indigestion or heartburn.   cholecalciferol 25 MCG (1000 UNIT) tablet Commonly known as: VITAMIN D3 Take 1,000 Units by mouth daily. Pt  unsure of mg.   escitalopram 10 MG tablet Commonly known as: LEXAPRO Take 20 mg by mouth daily.   Fish Oil 1000 MG Caps Take by mouth. Pt unsure of mg.   fluconazole 150 MG tablet Commonly known as: Diflucan Take 1 tablet (150 mg total) by mouth daily.   fluticasone 50 MCG/ACT nasal spray Commonly known as: FLONASE Place into both nostrils daily as needed. Uses mostly in summer   ibuprofen 800 MG tablet Commonly known as: ADVIL Take 1 tablet (800 mg total) by mouth every 8 (eight) hours as needed for moderate pain.   mirabegron ER 25 MG Tb24 tablet Commonly known as: MYRBETRIQ Take 1 tablet (25 mg total) by mouth daily.   multivitamin tablet Take 1 tablet by mouth daily.   oxybutynin 5 MG tablet Commonly known as: DITROPAN Take 1 tablet (5 mg total) by mouth 3 (three) times daily.   promethazine 12.5 MG tablet Commonly known as: PHENERGAN Take 1 tablet (12.5 mg total) by mouth every 6 (six) hours as needed for nausea or vomiting.   promethazine 12.5 MG tablet Commonly known as: PHENERGAN Take 1-2 tablets (12.5-25 mg total) by mouth every 6 (six) hours as needed for nausea.   traMADol 50 MG tablet Commonly known as: ULTRAM Take 1-2 tablets (50-100 mg total) by mouth every 6 (six) hours as needed for moderate pain.        Allergies:  Allergies  Allergen Reactions   Sulfa Antibiotics Anaphylaxis   Latex Swelling and Other (See Comments)    Redness.    Lidocaine Palpitations    Family History: Family History  Problem Relation Age of Onset   Kidney disease Mother    Cancer Paternal Aunt        ovarian?   Other Paternal Aunt        RAD51D gene mutation   Throat cancer Paternal Uncle        dx 50s/60s   Asthma Maternal Grandmother    COPD Maternal Grandmother    Diabetes Maternal Grandmother    Hypertension Maternal Grandmother    Macular degeneration Maternal Grandmother    Heart attack Maternal Grandfather    Congestive Heart Failure Maternal  Grandfather    Coronary artery disease Maternal Grandfather    Hypertension Maternal Grandfather    Alcoholism Maternal Grandfather    Colon cancer Paternal Grandmother        dx in her 39s   Cancer Paternal Grandmother 37       ovarian?   Other Paternal Grandmother        RAD51D gene mutation   Kidney cancer Paternal Grandfather    Cancer Maternal Great-grandmother        unknown type, dx >50   Cancer Cousin 22       ovarian? (paternal first cousin)   Other Cousin        RAD51D gene mutation (paternal first cousin)    Social History:  reports that she has been smoking cigarettes. She has a 7.50 pack-year smoking history. She has never used  smokeless tobacco. She reports current alcohol use. She reports that she does not use drugs.  ROS: All other review of systems were reviewed and are negative except what is noted above in HPI  Physical Exam: BP 99/66   Pulse 99   Ht 5' 2"  (1.575 m)   Wt 144 lb (65.3 kg)   BMI 26.34 kg/m   Constitutional:  Alert and oriented, No acute distress. HEENT: [place] AT, moist mucus membranes.  Trachea midline, no masses. Cardiovascular: No clubbing, cyanosis, or edema. Respiratory: Normal respiratory effort, no increased work of breathing. GI: Abdomen is soft, nontender, nondistended, no abdominal masses GU: No CVA tenderness.  Lymph: No cervical or inguinal lymphadenopathy. Skin: No rashes, bruises or suspicious lesions. Neurologic: Grossly intact, no focal deficits, moving all 4 extremities. Psychiatric: Normal mood and affect.  Laboratory Data: Lab Results  Component Value Date   WBC 14.9 (H) 08/14/2021   HGB 12.3 08/14/2021   HCT 37.2 08/14/2021   MCV 93.9 08/14/2021   PLT 206 08/14/2021    Lab Results  Component Value Date   CREATININE 0.63 08/14/2021    No results found for: PSA  No results found for: TESTOSTERONE  No results found for: HGBA1C  Urinalysis    Component Value Date/Time   COLORURINE YELLOW 11/05/2017 1119    APPEARANCEUR Clear 06/08/2021 1546   LABSPEC 1.020 11/05/2017 1119   PHURINE 5.0 11/05/2017 1119   GLUCOSEU Negative 06/08/2021 1546   HGBUR LARGE (A) 11/05/2017 1119   BILIRUBINUR Negative 06/08/2021 1546   KETONESUR negative 02/24/2021 1435   KETONESUR 5 (A) 11/05/2017 1119   PROTEINUR Trace (A) 06/08/2021 1546   PROTEINUR NEGATIVE 11/05/2017 1119   UROBILINOGEN 0.2 02/24/2021 1435   NITRITE Negative 06/08/2021 1546   NITRITE NEGATIVE 11/05/2017 1119   LEUKOCYTESUR Negative 06/08/2021 1546    Lab Results  Component Value Date   LABMICR See below: 06/08/2021   WBCUA 0-5 06/08/2021   LABEPIT 0-10 06/08/2021   MUCUS Present 12/16/2020   BACTERIA Few (A) 06/08/2021    Pertinent Imaging:  No results found for this or any previous visit.  No results found for this or any previous visit.  No results found for this or any previous visit.  No results found for this or any previous visit.  No results found for this or any previous visit.  No results found for this or any previous visit.  Results for orders placed during the hospital encounter of 05/15/20  CT HEMATURIA WORKUP  Narrative CLINICAL DATA:  Hematuria.  EXAM: CT ABDOMEN AND PELVIS WITHOUT AND WITH CONTRAST  TECHNIQUE: Multidetector CT imaging of the abdomen and pelvis was performed following the standard protocol before and following the bolus administration of intravenous contrast.  CONTRAST:  152m OMNIPAQUE IOHEXOL 300 MG/ML  SOLN  COMPARISON:  Abdomen/pelvis CT 02/19/2016  FINDINGS: Lower chest: Unremarkable  Hepatobiliary: Small area of low attenuation in the anterior liver, adjacent to the falciform ligament, is in a characteristic location for focal fatty deposition. No suspicious focal abnormality within the liver parenchyma. Gallbladder is surgically absent. No intrahepatic or extrahepatic biliary dilation.  Pancreas: No focal mass lesion. No dilatation of the main duct.  No intraparenchymal cyst. No peripancreatic edema.  Spleen: No splenomegaly. No focal mass lesion.  Adrenals/Urinary Tract: No adrenal nodule or mass.  Precontrast imaging shows no stones in either kidney or ureter. No bladder stones.  Imaging after IV contrast administration shows no suspicious enhancing mass lesion in either kidney. Tiny hypodensity in the  lower pole right kidney is too small to characterize at 3 mm but most likely benign. Delayed post-contrast imaging shows no wall thickening or soft tissue filling defect in either intrarenal collecting system or renal pelvis. Both ureters are well opacified without evidence for wall thickening, soft tissue lesion or focal dilatation. Delayed imaging of the bladder shows no focal wall thickening or mass lesion.  Stomach/Bowel: Stomach is unremarkable. No gastric wall thickening. No evidence of outlet obstruction. Duodenum is normally positioned as is the ligament of Treitz. No small bowel wall thickening. No small bowel dilatation. The terminal ileum is normal. The appendix is normal. Colon is diffusely decompressed.  Vascular/Lymphatic: No abdominal aortic aneurysm. There is no gastrohepatic or hepatoduodenal ligament lymphadenopathy. No retroperitoneal or mesenteric lymphadenopathy. No pelvic sidewall lymphadenopathy.  Reproductive: The uterus is unremarkable. There is no adnexal mass. 2.5 x 1.7 cm cyst identified in the region of the left vaginal introitus, characteristic appearance/location for Bartholin's cyst.  Other: No intraperitoneal free fluid.  Musculoskeletal: No worrisome lytic or sclerotic osseous abnormality.  IMPRESSION: 1. No urinary stone disease. No findings to explain the patient's history of hematuria and recurrent UTI. 2. No acute findings in the abdomen/pelvis.   Electronically Signed By: Misty Stanley M.D. On: 05/16/2020 10:00  No results found for this or any previous  visit.   Assessment & Plan:    1. OAB (overactive bladder) -continue mirabegron 57m daily - Urinalysis, Routine w reflex microscopic  2. Recurrent UTI -resolved   No follow-ups on file.  PNicolette Bang MD  [facility] [place]

## 2021-11-20 ENCOUNTER — Telehealth: Payer: Self-pay

## 2021-11-20 NOTE — Telephone Encounter (Signed)
Insurance denied coverage for Myrtbetriq '25mg'$ .  Patient is requesting more samples from office on Monday.  Okay to keep providing samples each month from office?  Please advise.

## 2021-11-25 ENCOUNTER — Telehealth: Payer: Self-pay

## 2021-11-25 NOTE — Telephone Encounter (Signed)
Verbal orders ok to give samples, pt aware samples at front desk.

## 2022-02-14 ENCOUNTER — Emergency Department (HOSPITAL_COMMUNITY)
Admission: EM | Admit: 2022-02-14 | Discharge: 2022-02-14 | Disposition: A | Discharge status: Home / Self Care | Payer: Medicaid Other | Attending: Emergency Medicine | Admitting: Emergency Medicine

## 2022-02-14 ENCOUNTER — Encounter (HOSPITAL_COMMUNITY): Payer: Self-pay | Admitting: *Deleted

## 2022-02-14 ENCOUNTER — Other Ambulatory Visit: Payer: Self-pay

## 2022-02-14 DIAGNOSIS — H5711 Ocular pain, right eye: Secondary | ICD-10-CM | POA: Diagnosis present

## 2022-02-14 DIAGNOSIS — Z91040 Latex allergy status: Secondary | ICD-10-CM | POA: Insufficient documentation

## 2022-02-14 DIAGNOSIS — H53149 Visual discomfort, unspecified: Secondary | ICD-10-CM | POA: Insufficient documentation

## 2022-02-14 MED ORDER — FLUORESCEIN SODIUM 1 MG OP STRP
1.0000 | ORAL_STRIP | Freq: Once | OPHTHALMIC | Status: AC
Start: 2022-02-14 — End: 2022-02-14
  Administered 2022-02-14: 1 via OPHTHALMIC
  Filled 2022-02-14: qty 1

## 2022-02-14 MED ORDER — TOBRAMYCIN 0.3 % OP SOLN: 2.0000 [drp] | OPHTHALMIC | 0 refills | Status: AC

## 2022-02-14 MED ORDER — TETRACAINE HCL 0.5 % OP SOLN
2.0000 [drp] | Freq: Once | OPHTHALMIC | Status: AC
  Administered 2022-02-14: 2 [drp] via OPHTHALMIC
  Filled 2022-02-14: qty 4

## 2022-02-14 NOTE — Discharge Instructions (Signed)
Please use antibiotic drops for the next 5 days as prescribed.  I would like you to follow-up with your primary care doctor for further evaluation.  Please return to the emergency department for any worsening symptoms you might have.

## 2022-02-14 NOTE — ED Triage Notes (Signed)
Pt with eye pain to right eye since waking up yesterday morning.  Put contact in eye yesterday and immediately started burning, pt then removed contact.  Just got back from beach today.

## 2022-02-14 NOTE — ED Provider Notes (Signed)
[facility] EMERGENCY DEPARTMENT Provider Note   CSN: [id number] Arrival date & time: 02/14/22  1708     History Chief Complaint  Patient presents with   Eye Pain    Laurie Warren is a 36 y.o. female patient who presents to the emergency department with right eye pain that started yesterday.  Patient states she just got back from the beach.  She states she woke up and had some right eye pain and tearing.  He denies any purulent drainage from the eye, trauma to the eye, vision loss.  She does endorse some photophobia.  No fever or chills.  No nasal congestion.   Eye Pain       Home Medications Prior to Admission medications   Medication Sig Start Date End Date Taking? Authorizing Provider  tobramycin (TOBREX) 0.3 % ophthalmic solution Place 2 drops into the right eye every 4 (four) hours. 02/14/22  Yes Timoty Bourke M, PA-C  AZO-CRANBERRY PO Take by mouth daily.     [provider]  calcium carbonate (TUMS - DOSED IN MG ELEMENTAL CALCIUM) 500 MG chewable tablet Chew 1 tablet by mouth as needed for indigestion or heartburn.    [provider]  cholecalciferol (VITAMIN D3) 25 MCG (1000 UNIT) tablet Take 1,000 Units by mouth daily. Pt unsure of mg.    [provider]  escitalopram (LEXAPRO) 10 MG tablet Take 20 mg by mouth daily. 04/17/21   [provider]  fluconazole (DIFLUCAN) 150 MG tablet Take 1 tablet (150 mg total) by mouth daily. 02/24/21   Fransico Meadow, PA-C  fluticasone (FLONASE) 50 MCG/ACT nasal spray Place into both nostrils daily as needed. Uses mostly in summer    [provider]  ibuprofen (ADVIL) 800 MG tablet Take 1 tablet (800 mg total) by mouth every 8 (eight) hours as needed for moderate pain. 08/14/21   Bovard-Stuckert, Jeral Fruit, MD  mirabegron ER (MYRBETRIQ) 25 MG TB24 tablet Take 1 tablet (25 mg total) by mouth daily. 11/06/21   McKenzie, Candee Furbish, MD  Multiple Vitamin (MULTIVITAMIN) tablet Take 1 tablet by mouth daily.     [provider]  Omega-3 Fatty Acids (FISH OIL) 1000 MG CAPS Take by mouth. Pt unsure of mg.    [provider]  oxybutynin (DITROPAN) 5 MG tablet Take 1 tablet (5 mg total) by mouth 3 (three) times daily. Patient not taking: Reported on 11/06/2021 09/02/21   Summerlin, Berneice Heinrich, PA-C  promethazine (PHENERGAN) 12.5 MG tablet Take 1 tablet (12.5 mg total) by mouth every 6 (six) hours as needed for nausea or vomiting. 08/14/21   Bovard-Stuckert, Jeral Fruit, MD  promethazine (PHENERGAN) 12.5 MG tablet Take 1-2 tablets (12.5-25 mg total) by mouth every 6 (six) hours as needed for nausea. 08/14/21   Bovard-Stuckert, Jeral Fruit, MD  traMADol (ULTRAM) 50 MG tablet Take 1-2 tablets (50-100 mg total) by mouth every 6 (six) hours as needed for moderate pain. Patient not taking: Reported on 11/06/2021 08/14/21   Janyth Contes, MD      Allergies    Sulfa antibiotics, Latex, and Lidocaine    Review of Systems   Review of Systems  Eyes:  Positive for pain.  All other systems reviewed and are negative.   Physical Exam Updated Vital Signs BP 119/75 (BP Location: Left Arm)   Pulse (!) 109   Temp 97.9 F (36.6 C) (Oral)   Resp 18   Ht '5\' 2"'$  (1.575 m)   Wt 61.2 kg   SpO2 100%  BMI 24.69 kg/m  Physical Exam Vitals and nursing note reviewed.  Constitutional:      Appearance: Normal appearance.  HENT:     Head: Normocephalic and atraumatic.  Eyes:     General:        Right eye: No discharge.        Left eye: No discharge.     Conjunctiva/sclera: Conjunctivae normal.     Comments: Mild conjunctival injection on the right.  There was no obvious uptake with fluorescein.  There is some mild clear tearing in the right eye.  Pulmonary:     Effort: Pulmonary effort is normal.  Skin:    General: Skin is warm and dry.     Findings: No rash.  Neurological:     General: No focal deficit present.     Mental Status: She is alert.  Psychiatric:        Mood and Affect: Mood normal.         Behavior: Behavior normal.     ED Results / Procedures / Treatments   Labs (all labs ordered are listed, but only abnormal results are displayed) Labs Reviewed - No data to display  EKG None  Radiology No results found.  Procedures Procedures    Medications Ordered in ED Medications  tetracaine (PONTOCAINE) 0.5 % ophthalmic solution 2 drop (2 drops Right Eye Given 02/14/22 1837)  fluorescein ophthalmic strip 1 strip (1 strip Right Eye Given 02/14/22 1837)    ED Course/ Medical Decision Making/ A&P                           Medical Decision Making Laurie Warren is a 36 y.o. female patient who presents to the emergency department today for further evaluation of right eye pain.  Tetracaine over the eye which immediately took her pain away in addition to her photophobia.  She was able to blink without difficulty.  Fluorescein did not reveal any obvious uptake with Woods lamp.  I will cover her with tobramycin drops as the patient does wear contacts.  I instructed her this will get better in the coming days.  She was encouraged to return to the emergency department for any worsening symptoms at this time.  She is safe for discharge.  I have a low suspicion for acute ankle closure glaucoma, retinal detachments, or any other life-threatening emergencies at this time.   Risk Prescription drug management.   Final Clinical Impression(s) / ED Diagnoses Final diagnoses:  Pain of right eye    Rx / DC Orders ED Discharge Orders          Ordered    tobramycin (TOBREX) 0.3 % ophthalmic solution  Every 4 hours        02/14/22 1900              Hendricks Limes, Hershal Coria 02/14/22 Aline August, MD 02/15/22 [phone]

## 2022-03-18 ENCOUNTER — Telehealth: Payer: Self-pay

## 2022-03-18 NOTE — Telephone Encounter (Signed)
Patient called requesting Myrbetriq samples, informed patient that her insurance had approved the medication and she has a prescription at the pharmacy with refills.  Patient voiced understanding and will reach out to pharmacy and call back if there is any other issues.

## 2022-07-27 ENCOUNTER — Other Ambulatory Visit: Payer: Self-pay | Admitting: General Surgery

## 2022-07-27 DIAGNOSIS — N6002 Solitary cyst of left breast: Secondary | ICD-10-CM

## 2022-08-04 ENCOUNTER — Ambulatory Visit
Admission: RE | Admit: 2022-08-04 | Discharge: 2022-08-04 | Disposition: A | Discharge status: Home / Self Care | Payer: Medicaid Other | Source: Ambulatory Visit | Attending: General Surgery | Admitting: General Surgery

## 2022-08-04 DIAGNOSIS — N6002 Solitary cyst of left breast: Secondary | ICD-10-CM

## 2022-11-12 ENCOUNTER — Ambulatory Visit: Payer: Medicaid Other | Admitting: Urology

## 2022-11-12 DIAGNOSIS — N3281 Overactive bladder: Secondary | ICD-10-CM

## 2022-11-21 IMAGING — MG DIGITAL DIAGNOSTIC BILAT W/ TOMO W/ CAD
6 of 10 series · 6 of 30 positions shown · non-contrast
Comparison: None.

CLINICAL DATA: Patient presents with a palpable lump in the
retroareolar left breast. Referring clinician also reported right
breast lump, which the patient does not feel and which was not
localized on the order.

EXAM:
DIGITAL DIAGNOSTIC BILATERAL MAMMOGRAM WITH TOMOSYNTHESIS AND CAD;
ULTRASOUND LEFT BREAST LIMITED
TECHNIQUE: Bilateral digital diagnostic mammography and breast tomosynthesis
was performed. The images were evaluated with computer-aided
detection.; Targeted ultrasound examination of the left breast was
performed

[L CC synth-2D]
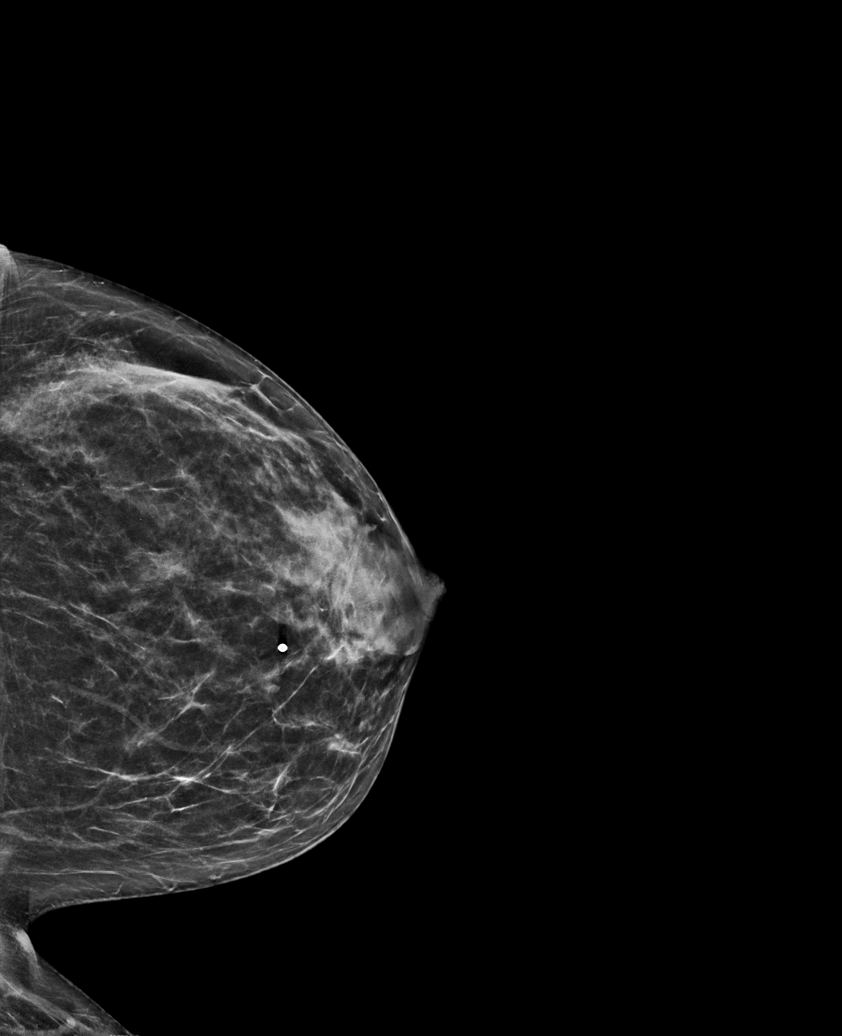

[L MLO synth-2D (1 of 2)]
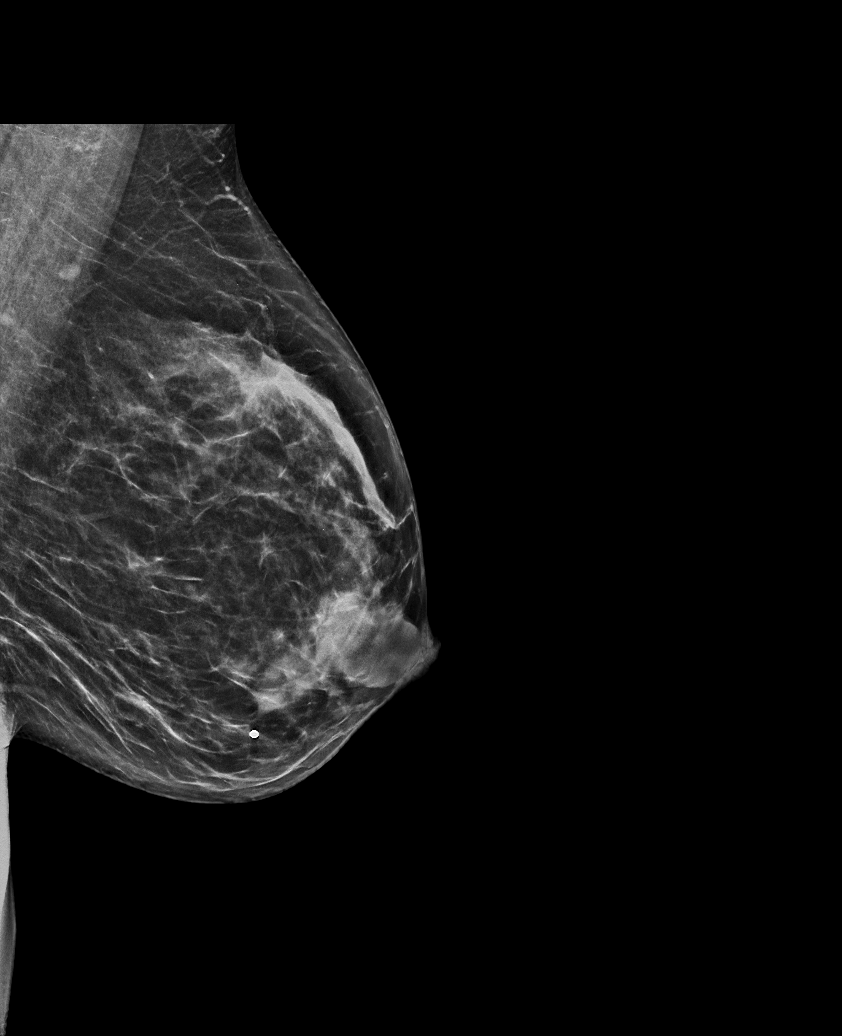

[R MLO synth-2D]
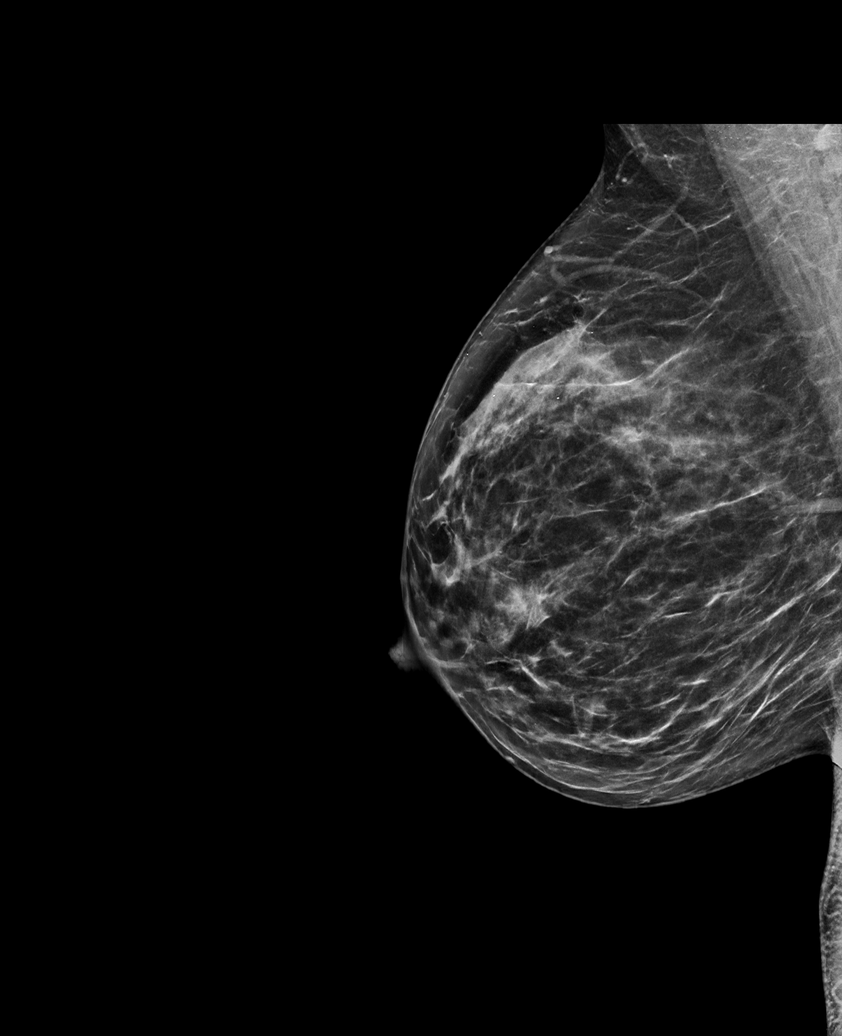

[L MLO synth-2D (2 of 2)]
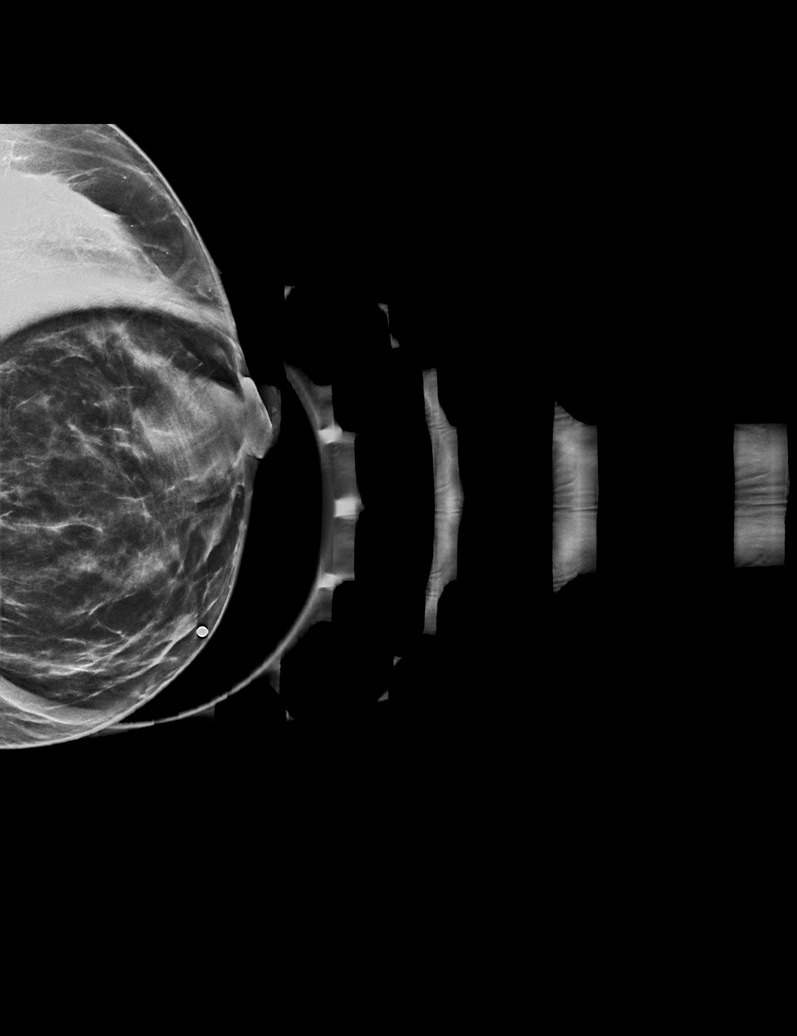

[R CC synth-2D]
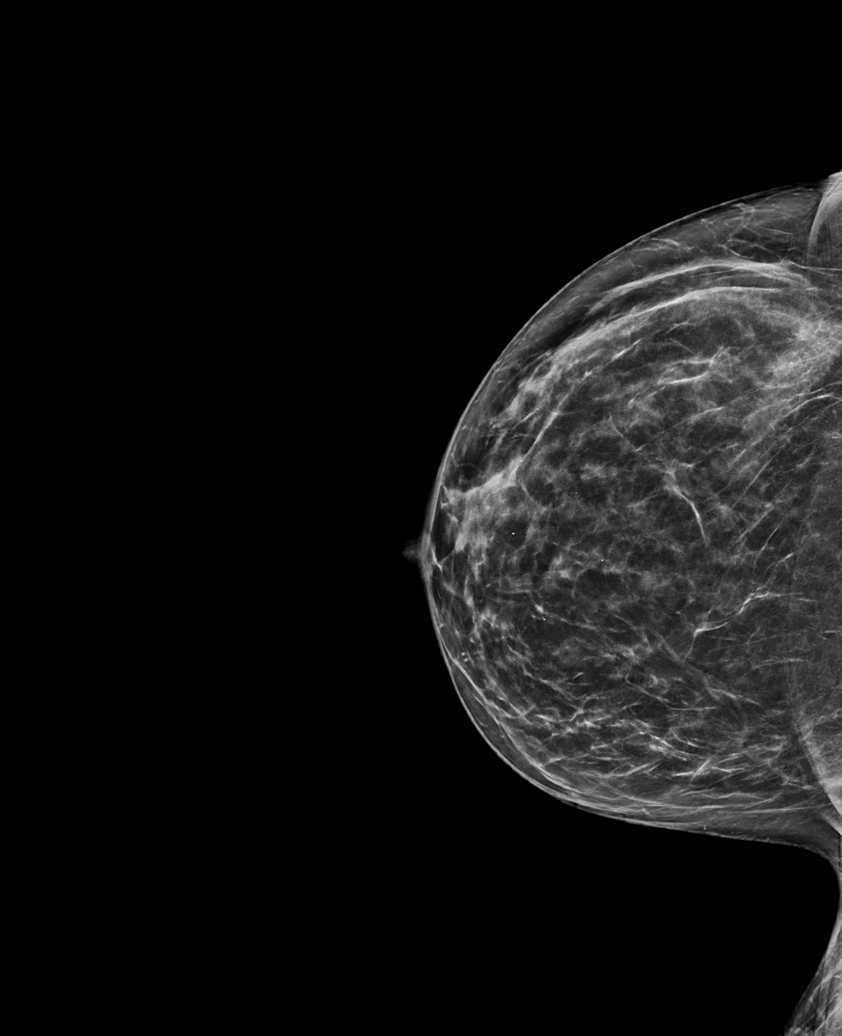

[R CC tomo · tomo slice 33/64.0]
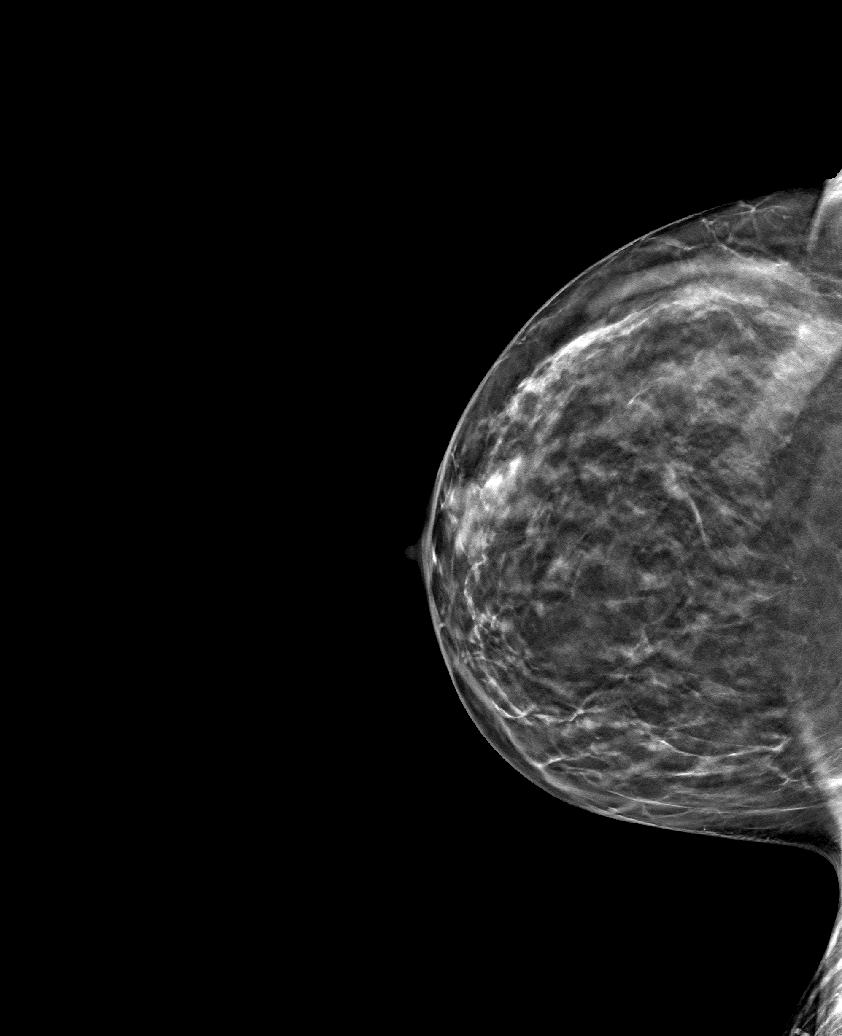

[6 of 30 positions shown; findings below may reference images not displayed]

ACR Breast Density Category b: There are scattered areas of
fibroglandular density.
FINDINGS: In the right breast, there are no masses, areas of architectural
distortion, areas of significant asymmetry or suspicious
calcifications.

In the left breast, there is a circumscribed oval mass in the
retroareolar breast. There are no other masses, no areas of
architectural distortion and no suspicious calcifications.

On physical exam, a smooth mobile mass is palpated in the
retroareolar left breast.

Targeted left breast ultrasound is performed, showing a simple cyst
in the retroareolar region measuring 2.7 x 2.1 x 2.6 cm. This
corresponds to the palpable abnormality. There are no solid masses
or suspicious lesions.
IMPRESSION: 1. No evidence of breast malignancy.
2. Benign retroareolar left breast cyst.

RECOMMENDATION:
Screening mammogram at age 39 unless there are persistent or
intervening clinical concerns. (Code:DB-[id number])

I have discussed the findings and recommendations with the patient.
If applicable, a reminder letter will be sent to the patient
regarding the next appointment.

BI-RADS CATEGORY  2: Benign.
